# Patient Record
Sex: Female | Born: 1935
Health system: Southern US, Community
[De-identification: ages and names within clinical notes are randomized; demographics above are authoritative.]

## PROBLEM LIST (undated history)

## (undated) DIAGNOSIS — H269 Unspecified cataract: Secondary | ICD-10-CM

## (undated) DIAGNOSIS — F32A Depression, unspecified: Secondary | ICD-10-CM

## (undated) DIAGNOSIS — I10 Essential (primary) hypertension: Secondary | ICD-10-CM

## (undated) DIAGNOSIS — F329 Major depressive disorder, single episode, unspecified: Secondary | ICD-10-CM

## (undated) DIAGNOSIS — R739 Hyperglycemia, unspecified: Secondary | ICD-10-CM

## (undated) HISTORY — PX: CATARACT EXTRACTION, BILATERAL: SHX1313

## (undated) HISTORY — DX: Unspecified cataract: H26.9

## (undated) HISTORY — DX: Essential (primary) hypertension: I10

## (undated) HISTORY — DX: Hyperglycemia, unspecified: R73.9

## (undated) HISTORY — PX: TONSILLECTOMY: SUR1361

---
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
## 2014-11-21 ENCOUNTER — Emergency Department (HOSPITAL_COMMUNITY)
Admission: EM | Admit: 2014-11-21 | Discharge: 2014-11-21 | Disposition: A | Discharge status: Home / Self Care | Payer: Medicare HMO | Attending: Emergency Medicine | Admitting: Emergency Medicine

## 2014-11-21 ENCOUNTER — Ambulatory Visit (INDEPENDENT_AMBULATORY_CARE_PROVIDER_SITE_OTHER): Payer: Medicare HMO

## 2014-11-21 ENCOUNTER — Ambulatory Visit (INDEPENDENT_AMBULATORY_CARE_PROVIDER_SITE_OTHER): Payer: Medicare HMO | Admitting: Emergency Medicine

## 2014-11-21 ENCOUNTER — Telehealth: Payer: Self-pay | Admitting: Physician Assistant

## 2014-11-21 VITALS — BP 192/96 | HR 105 | Temp 98.3°F | Resp 16 | Ht 61.0 in | Wt 150.0 lb

## 2014-11-21 DIAGNOSIS — M79645 Pain in left finger(s): Secondary | ICD-10-CM | POA: Diagnosis not present

## 2014-11-21 DIAGNOSIS — Z8669 Personal history of other diseases of the nervous system and sense organs: Secondary | ICD-10-CM | POA: Insufficient documentation

## 2014-11-21 DIAGNOSIS — Z79899 Other long term (current) drug therapy: Secondary | ICD-10-CM | POA: Diagnosis not present

## 2014-11-21 DIAGNOSIS — I10 Essential (primary) hypertension: Secondary | ICD-10-CM

## 2014-11-21 DIAGNOSIS — R079 Chest pain, unspecified: Secondary | ICD-10-CM

## 2014-11-21 DIAGNOSIS — M25532 Pain in left wrist: Secondary | ICD-10-CM | POA: Diagnosis not present

## 2014-11-21 DIAGNOSIS — Z880 Allergy status to penicillin: Secondary | ICD-10-CM | POA: Diagnosis not present

## 2014-11-21 LAB — BASIC METABOLIC PANEL WITH GFR
BUN: 13 mg/dL (ref 7–25)
CO2: 25 mmol/L (ref 20–31)
CREATININE: 0.76 mg/dL (ref 0.60–0.93)
Calcium: 9.8 mg/dL (ref 8.6–10.4)
Chloride: 104 mmol/L (ref 98–110)
GFR, EST AFRICAN AMERICAN: 88 mL/min (ref >=60)
GFR, Est Non African American: 76 mL/min (ref >=60)
GLUCOSE: 96 mg/dL (ref 65–99)
Potassium: 4.2 mmol/L (ref 3.5–5.3)
Sodium: 141 mmol/L (ref 135–146)

## 2014-11-21 LAB — BASIC METABOLIC PANEL
ANION GAP: 11 (ref 5–15)
BUN: 10 mg/dL (ref 6–20)
CO2: 26 mmol/L (ref 22–32)
CREATININE: 0.8 mg/dL (ref 0.44–1.00)
Calcium: 10 mg/dL (ref 8.9–10.3)
Chloride: 104 mmol/L (ref 101–111)
Glucose, Bld: 90 mg/dL (ref 65–99)
Potassium: 3.7 mmol/L (ref 3.5–5.1)
SODIUM: 141 mmol/L (ref 135–145)

## 2014-11-21 LAB — POCT CBC
Granulocyte percent: 48.3 %G (ref 37–80)
HCT, POC: 44.5 % (ref 37.7–47.9)
Hemoglobin: 14.4 g/dL (ref 12.2–16.2)
LYMPH, POC: 5.1 — AB (ref 0.6–3.4)
MCH, POC: 28.8 pg (ref 27–31.2)
MCHC: 32.4 g/dL (ref 31.8–35.4)
MCV: 89.1 fL (ref 80–97)
MID (cbc): 0.7 (ref 0–0.9)
MPV: 7 fL (ref 0–99.8)
PLATELET COUNT, POC: 203 10*3/uL (ref 142–424)
POC GRANULOCYTE: 5.4 (ref 2–6.9)
POC LYMPH PERCENT: 45.2 %L (ref 10–50)
POC MID %: 6.5 % (ref 0–12)
RBC: 4.99 M/uL (ref 4.04–5.48)
RDW, POC: 13.9 %
WBC: 11.2 10*3/uL — AB (ref 4.6–10.2)

## 2014-11-21 LAB — CBC WITH DIFFERENTIAL/PLATELET
BASOS ABS: 0.1 10*3/uL (ref 0.0–0.1)
Basophils Relative: 1 % (ref 0–1)
EOS ABS: 0.4 10*3/uL (ref 0.0–0.7)
Eosinophils Relative: 3 % (ref 0–5)
HCT: 44.5 % (ref 36.0–46.0)
HEMOGLOBIN: 14.6 g/dL (ref 12.0–15.0)
LYMPHS ABS: 5.3 10*3/uL — AB (ref 0.7–4.0)
Lymphocytes Relative: 46 % (ref 12–46)
MCH: 29.8 pg (ref 26.0–34.0)
MCHC: 32.8 g/dL (ref 30.0–36.0)
MCV: 90.8 fL (ref 78.0–100.0)
Monocytes Absolute: 0.6 10*3/uL (ref 0.1–1.0)
Monocytes Relative: 6 % (ref 3–12)
Neutro Abs: 5 10*3/uL (ref 1.7–7.7)
Neutrophils Relative %: 44 % (ref 43–77)
PLATELETS: 185 10*3/uL (ref 150–400)
RBC: 4.9 MIL/uL (ref 3.87–5.11)
RDW: 14.3 % (ref 11.5–15.5)
WBC: 11.3 10*3/uL — AB (ref 4.0–10.5)

## 2014-11-21 LAB — I-STAT TROPONIN, ED: Troponin i, poc: 0 ng/mL (ref 0.00–0.08)

## 2014-11-21 LAB — TSH: TSH: 2.996 u[IU]/mL (ref 0.350–4.500)

## 2014-11-21 MED ORDER — ASPIRIN 81 MG PO CHEW
324.0000 mg | CHEWABLE_TABLET | Freq: Once | ORAL | Status: AC
  Administered 2014-11-21: 324 mg via ORAL
  Filled 2014-11-21: qty 4

## 2014-11-21 MED ORDER — NITROGLYCERIN 0.4 MG SL SUBL
0.4000 mg | SUBLINGUAL_TABLET | SUBLINGUAL | Status: DC | PRN
  Filled 2014-11-21: qty 1

## 2014-11-21 MED ORDER — AMLODIPINE BESYLATE 5 MG PO TABS: 5.0000 mg | ORAL_TABLET | Freq: Every day | ORAL | Status: DC

## 2014-11-21 NOTE — Telephone Encounter (Signed)
ER physician called Trish/cardmaster pager to request close outpatient follow-up for this new patient. Per report had chest pain 2 weeks ago. Sent to ER by PCP for abnormal EKG but had not had any recent recurrent chest pain. ER will be discharging home but requests very soon appointment. OK to put on flex as new patient. Please call the patient to arrange. Dayna Dunn PA-C

## 2014-11-21 NOTE — Discharge Instructions (Signed)
If you were given medicines take as directed.  If you are on coumadin or contraceptives realize their levels and effectiveness is altered by many different medicines.  If you have any reaction (rash, tongues swelling, other) to the medicines stop taking and see a physician.   Take aspirin daily until you see the cardiologist to discuss further. The cardiologist will call you for an appointment moral. If your blood pressure was elevated in the ER make sure you follow up for management with a primary doctor or return for chest pain, shortness of breath or stroke symptoms.  Please follow up as directed and return to the ER or see a physician for new or worsening symptoms.  Thank you. Filed Vitals:   11/21/14 1249 11/21/14 1250 11/21/14 1300 11/21/14 1315  BP: 148/90 148/90 149/87 152/98  Pulse:  75 71 79  Temp:      TempSrc:      Resp:  SpO2:  98% 97% 97%

## 2014-11-21 NOTE — ED Notes (Signed)
Patient sent to [facility] ED from [facility]gent care with C/O abnormal EKG. Denies chest pain, However C/O chest pain 2 weeks ago

## 2014-11-21 NOTE — Progress Notes (Addendum)
Patient ID: Kara Solomon, female   DOB: 11/10/1935, 79 y.o.   MRN: [id number]    This chart was scribed for Earl Lites, MD by [facility], medical scribe at [facility].The patient was seen in exam room 07 and the patient's care was started at 9:40 AM.  Chief Complaint:  Chief Complaint  Patient presents with  . Wrist Pain    Left/ onset 1 year off and on, getting worse.   HPI: Kara Solomon is a 79 y.o. female who reports to [facility] today complaining of left wrist pain worsening since last night. She has had this pain intermittently for the past year. Knits frequently, but has not this past summer. She also complains of a left shoulder pain. No known injuries or trauma.  Blood pressure is elevated today, she says this is due to being in the office. She does not take medication for this. Nobody follows her blood pressure. Recheck is 190/100. She did complain of chest pain, which was two weeks ago but this has resolved. Recent family stressors, nephew recently passed away and son-in-law committed suicide. Also, comforting her daughter. Returned from [place]. A Non-smoker.  Past Medical History  Diagnosis Date  . Cataract    History reviewed. No pertinent past surgical history. History   Social History  . Marital Status: Married    Spouse Name: N/A  . Number of Children: N/A  . Years of Education: N/A   Social History Main Topics  . Smoking status: Never Smoker   . Smokeless tobacco: Not on file  . Alcohol Use: No  . Drug Use: No  . Sexual Activity: Not on file   Other Topics Concern  . None   Social History Narrative  . None   Family History  Problem Relation Age of Onset  . Heart disease Sister    Allergies  Allergen Reactions  . Penicillins Hives   Prior to Admission medications   Medication Sig Start Date End Date Taking? Authorizing Provider  Multiple Vitamins-Minerals (MULTIVITAMIN WITH MINERALS) tablet Take 1 tablet by mouth daily.   Yes  Historical Provider, MD   ROS: The patient denies fevers, chills, night sweats, unintentional weight loss, chest pain, palpitations, wheezing, dyspnea on exertion, nausea, vomiting, abdominal pain, dysuria, hematuria, melena, numbness, weakness, or tingling.   All other systems have been reviewed and were otherwise negative with the exception of those mentioned in the HPI and as above.    PHYSICAL EXAM: Filed Vitals:   11/21/14 0852  BP: 192/96  Pulse: 105  Temp: 98.3 F (36.8 C)  Resp: 16   Body mass index is 28.36 kg/(m^2).  General: Alert, no acute distress HEENT:  Normocephalic, atraumatic, oropharynx patent. Eye: Nonie Hoyer [facility] Cardiovascular:  Regular rate and rhythm, no rubs murmurs or gallops.  No Carotid bruits, radial pulse intact. No pedal edema.  Respiratory: Clear to auscultation bilaterally.  No wheezes, rales, or rhonchi.  No cyanosis, no use of accessory musculature Abdominal: No organomegaly, abdomen is soft and non-tender, positive bowel sounds.  No masses. Musculoskeletal: Gait intact. No edema, tenderness Skin: No rashes. Neurologic: Facial musculature symmetric. Psychiatric: Patient acts appropriately throughout our interaction. Lymphatic: No cervical or submandibular lymphadenopathy Genitourinary/Anorectal: No acute findings  LABS: No results found for this or any previous visit.  EKG/XRAY:   Primary read interpreted by Dr. Cleta Alberts at [facility]. There is an atelectatic area right midlung. Heart size is normal. Wrist and thumb films are normal. EKG shows poor R-wave progression across  the precordium with loss of the R-wave in lead V3. There is T-wave inversion in V1 V2 suspicious for previous anterior injury. ASSESSMENT/PLAN: Patient had an episode of chest pain about 2 weeks ago. She's also had a travel history. Her EKG shows the possibility of previous injury to the heart. Will send by EMS for enzymes and consideration for d-dimer with her travel history and history  of chest pain. I am not sure if the discomfort in her left wrist and thumb is related to an orthopedic problem or heart/lung  related. There is no EKG available for comparison. Gross sideeffects, risk and benefits, and alternatives of medications d/w patient. Patient is aware that all medications have potential sideeffects and we are unable to predict every sideeffect or drug-drug interaction that may occur.    Lesle Chris MD 11/21/2014 9:42 AM

## 2014-11-21 NOTE — ED Provider Notes (Signed)
CSN: [id number]     Arrival date & time 11/21/14  1131 History   First MD Initiated Contact with Patient 11/21/14 1136     No chief complaint on file.    (Consider location/radiation/quality/duration/timing/severity/associated sxs/prior Treatment) HPI Comments: 79 year old female with no known medical problems nonsmoker, uses urgent care for primary Dr. was sent over from urgent care for abnormal EKG. Patient went to urgent care for left wrist pain worse with movement that was worsening earlier today. Patient had brief episode of chest pain lasting 20 minutes that may evoke her from sleep 2 weeks prior. Patient has not had any chest pain since then. No radiation down the left arm. No diaphoresis or exertional symptoms. Patient had mild nausea a few days back. No recent stress test no heart history known, Patient denies blood clot history, active cancer, recent major trauma or surgery, unilateral leg swelling/ pain, hemoptysis or oral contraceptives. Patient did have tripped overseas a while back proximal to go however said no pleuritic pain no shortness of breath, no leg swelling no blood clot history.   The history is provided by the patient.    Past Medical History  Diagnosis Date  . Cataract    No past surgical history on file. Family History  Problem Relation Age of Onset  . Heart disease Sister    History  Substance Use Topics  . Smoking status: Never Smoker   . Smokeless tobacco: Not on file  . Alcohol Use: No   OB History    No data available     Review of Systems  Constitutional: Negative for fever and chills.  HENT: Negative for congestion.   Eyes: Negative for visual disturbance.  Respiratory: Negative for shortness of breath.   Cardiovascular: Positive for chest pain.  Gastrointestinal: Negative for vomiting and abdominal pain.  Genitourinary: Negative for dysuria and flank pain.  Musculoskeletal: Positive for arthralgias. Negative for back pain, neck pain and neck  stiffness.  Skin: Negative for rash.  Neurological: Negative for light-headedness and headaches.      Allergies  Penicillins  Home Medications   Prior to Admission medications   Medication Sig Start Date End Date Taking? Authorizing Provider  Multiple Vitamins-Minerals (MULTIVITAMIN WITH MINERALS) tablet Take 1 tablet by mouth daily.   Yes Historical Provider, MD  amLODipine (NORVASC) 5 MG tablet Take 1 tablet (5 mg total) by mouth daily. 11/21/14   Blane Ohara, MD   BP 152/98 mmHg  Pulse 79  Temp(Src) 98.3 F (36.8 C) (Oral)  Resp 16  SpO2 97% Physical Exam  Constitutional: She is oriented to person, place, and time. She appears well-developed and well-nourished.  HENT:  Head: Normocephalic and atraumatic.  Eyes: Conjunctivae are normal. Right eye exhibits no discharge. Left eye exhibits no discharge.  Neck: Normal range of motion. Neck supple. No tracheal deviation present.  Cardiovascular: Normal rate, regular rhythm and intact distal pulses.   Pulmonary/Chest: Effort normal and breath sounds normal.  Abdominal: Soft. She exhibits no distension. There is no tenderness. There is no guarding.  Musculoskeletal: She exhibits no edema or tenderness.  Neurological: She is alert and oriented to person, place, and time.  Skin: Skin is warm. No rash noted.  Psychiatric: She has a normal mood and affect.  Nursing note and vitals reviewed.   ED Course  Procedures (including critical care time) Labs Review Labs Reviewed  CBC WITH DIFFERENTIAL/PLATELET - Abnormal; Notable for the following:    WBC 11.3 (*)    Lymphs Abs 5.3 (*)  All other components within normal limits  BASIC METABOLIC PANEL  I-STAT TROPOININ, ED    Imaging Review Dg Chest 2 View  11/21/2014   CLINICAL DATA:  Shortness of breath  EXAM: CHEST  2 VIEW  COMPARISON:  None.  FINDINGS: No active infiltrate or effusion is seen. There may be minimal linear atelectasis at the left lung base. Mediastinal and hilar  contours are unremarkable. Cardiomegaly is noted. No bony abnormality is seen.  IMPRESSION: No active lung disease. Probable mild linear atelectasis or scarring at the left lung base.   Electronically Signed   By: Dwyane Dee M.D.   On: 11/21/2014 10:48   Dg Wrist Complete Left  11/21/2014   CLINICAL DATA:  Wrist pain  EXAM: LEFT WRIST - COMPLETE 3+ VIEW  COMPARISON:  None.  FINDINGS: The radiocarpal joint space appears normal. The ulnar styloid is intact. The carpal bones are in normal position with normal alignment.  IMPRESSION: Negative.   Electronically Signed   By: Dwyane Dee M.D.   On: 11/21/2014 10:50   Dg Finger Thumb Left  11/21/2014   CLINICAL DATA:  Left thumb pain  EXAM: LEFT THUMB 2+V  COMPARISON:  None.  FINDINGS: No acute bony abnormality. Specifically, no fracture, subluxation, or dislocation. Soft tissues are intact.  IMPRESSION: No acute bony abnormality.   Electronically Signed   By: Charlett Nose M.D.   On: 11/21/2014 10:53     EKG Interpretation   Date/Time:  Wednesday November 21 2014 11:37:40 EDT Ventricular Rate:  85 PR Interval:  163 QRS Duration: 81 QT Interval:  408 QTC Calculation: 485 R Axis:   -12 Text Interpretation:  Sinus rhythm Probable left atrial enlargement Low  voltage, precordial leads Consider anterior infarct Confirmed by Hazelene Doten   MD, Jash Wahlen ([id number]) on 11/21/2014 11:41:32 AM      MDM   Final diagnoses:  Chest pain, unspecified chest pain type  Essential hypertension   Patient presents after 20 minute chest pain 2 weeks prior. Patient has mild T-wave inversion in 2 leads, no active chest pain or shortness of breath for 2 weeks. Vitals improved with no treatment blood pressure 140s during my exam systolic. Discussed observation of his close outpatient follow-up, patient comfort with close outpatient follow-up. I discussed with cardiology physician assistant directly will have them call the patient for close appointment and likely stress test. Recommended  aspirin daily until then. We'll start blood pressure medication.  WELLS 0.  Results and differential diagnosis were discussed with the patient/parent/guardian. Xrays were independently reviewed by myself.  Close follow up outpatient was discussed, comfortable with the plan.   Medications  aspirin chewable tablet 324 mg (324 mg Oral Given 11/21/14 1318)    Filed Vitals:   11/21/14 1249 11/21/14 1250 11/21/14 1300 11/21/14 1315  BP: 148/90 148/90 149/87 152/98  Pulse:  75 71 79  Temp:      TempSrc:      Resp:  11 18 16   SpO2:  98% 97% 97%    Final diagnoses:  Chest pain, unspecified chest pain type  Essential hypertension        Blane Ohara, MD 11/21/14 1357

## 2014-11-22 ENCOUNTER — Encounter: Payer: Self-pay | Admitting: Family Medicine

## 2014-11-26 ENCOUNTER — Ambulatory Visit (INDEPENDENT_AMBULATORY_CARE_PROVIDER_SITE_OTHER): Payer: Medicare HMO | Admitting: Nurse Practitioner

## 2014-11-26 ENCOUNTER — Encounter: Payer: Self-pay | Admitting: Nurse Practitioner

## 2014-11-26 VITALS — BP 220/90 | HR 106 | Ht 61.5 in | Wt 152.0 lb

## 2014-11-26 DIAGNOSIS — R079 Chest pain, unspecified: Secondary | ICD-10-CM

## 2014-11-26 DIAGNOSIS — I10 Essential (primary) hypertension: Secondary | ICD-10-CM | POA: Diagnosis not present

## 2014-11-26 MED ORDER — LISINOPRIL 10 MG PO TABS: 10.0000 mg | ORAL_TABLET | Freq: Every day | ORAL | Status: DC

## 2014-11-26 NOTE — Progress Notes (Signed)
CARDIOLOGY OFFICE NOTE  Date:  11/26/2014    Kara Solomon Date of Birth: March 13, 1936 Medical Record #[id number]  PCP:  No PCP Per Patient  Cardiologist:  Mayford Knife (NEW)    Chief Complaint  Patient presents with  . Chest Pain    New patient visit - seen for Dr. Mayford Knife (DOD)    History of Present Illness: Kara Solomon is a 79 y.o. female who presents today for a new patient visit. Seen for Dr. Mayford Knife. She has been referred by PCP.   She was in the ER last week - had been sent over by urgent care for abnormal EKG. She had been to the Urgent Care for left wrist pain. She has had wrist pain for 1 year off and on - thought it may be carpal tunnel (she knits a lot). BP quite high at UC.  She had had had brief episode of chest pain lasting 20 minutes that may have evoked her from sleep 2 weeks prior. She has just recently been placed on Norvasc for her BP.   Her evaluation was negative in the ER. She was to follow up here and be considered for a stress test.  Comes in today. Here with her daughter. She has really never been one to go to the doctor. She has had HTN - really on no medicines until here recently. BP has probably been up for quite some time. She does not like to take medicines.  She does not smoke. No real FH for CAD. Eats out frequently - most likely gets too much salt. Notes that her taste buds are off. She took her wrist pain as a "warning" that she needed to get checked out. She was in [place] back in June/July for a wedding and did fine - lots of walking, etc. She admits to being nervous today and anxious.   Past Medical History  Diagnosis Date  . Cataract   . HTN (hypertension)   . Borderline hyperglycemia     Past Surgical History  Procedure Laterality Date  . Tonsillectomy       Medications: Current Outpatient Prescriptions  Medication Sig Dispense Refill  . amLODipine (NORVASC) 5 MG tablet Take 1 tablet (5 mg total) by mouth daily. 30 tablet 0  . Multiple  Vitamins-Minerals (MULTIVITAMIN WITH MINERALS) tablet Take 1 tablet by mouth daily.    Marland Kitchen lisinopril (PRINIVIL,ZESTRIL) 10 MG tablet Take 1 tablet (10 mg total) by mouth daily. 30 tablet 6   No current facility-administered medications for this visit.    Allergies: Allergies  Allergen Reactions  . Penicillins Hives    Social History: The patient  reports that she has never smoked. She does not have any smokeless tobacco history on file. She reports that she does not drink alcohol or use illicit drugs.   Family History: The patient's family history includes Heart disease in her sister; Heart failure in her mother; Stroke (age of onset: 21) in her father.   Review of Systems: Please see the history of present illness.   Otherwise, the review of systems is positive for none.   All other systems are reviewed and negative.   Physical Exam: VS:  BP 220/90 mmHg  Pulse 106  Ht 5' 1.5" (1.562 m)  Wt 152 lb (68.947 kg)  BMI 28.26 kg/m2  SpO2 94% .  BMI Body mass index is 28.26 kg/(m^2).  Wt Readings from Last 3 Encounters:  11/26/14 152 lb (68.947 kg)  11/21/14 150 lb (68.04 kg)  General: Pleasant. Well developed, well nourished and in no acute distress.  HEENT: Normal. Neck: Supple, no JVD, carotid bruits, or masses noted.  Cardiac: Regular rate and rhythm. No murmurs, rubs, or gallops. No edema.  Respiratory:  Lungs are clear to auscultation bilaterally with normal work of breathing.  GI: Soft and nontender.  MS: No deformity or atrophy. Gait and ROM intact. Skin: Warm and dry. Color is normal.  Neuro:  Strength and sensation are intact and no gross focal deficits noted.  Psych: Alert, appropriate and with normal affect.   LABORATORY DATA:  EKG:  EKG is ordered today. This demonstrates NSR with poor R wave progression. Reviewed with Dr. Mayford Knife.   Lab Results  Component Value Date   WBC 11.3* 11/21/2014   HGB 14.6 11/21/2014   HCT 44.5 11/21/2014   PLT 185 11/21/2014    GLUCOSE 90 11/21/2014   NA 141 11/21/2014   K 3.7 11/21/2014   CL 104 11/21/2014   CREATININE 0.80 11/21/2014   BUN 10 11/21/2014   CO2 26 11/21/2014   TSH 2.996 11/21/2014    BNP (last 3 results) No results for input(s): BNP in the last 8760 hours.  ProBNP (last 3 results) No results for input(s): PROBNP in the last 8760 hours.   Other Studies Reviewed Today:   Assessment/Plan: 1. Chest pain/wrist pain - uncertain etiology. No exertional symptoms reported.   2. Abnormal EKG  3. HTN - uncontrolled - I suspect it has been uncontrolled for quite some time. Will need to bring her down slowly.   Discussed with Dr. Mayford Knife - will arrange for stress Myoview and echocardiogram. Adding Lisinopril 10 mg a day. Check fasting labs on day of her testing. I will see back in 2 weeks.   Current medicines are reviewed with the patient today.  The patient does not have concerns regarding medicines other than what has been noted above.  The following changes have been made:  See above.  Labs/ tests ordered today include:    Orders Placed This Encounter  Procedures  . Hepatic function panel  . Lipid panel  . Basic metabolic panel  . Myocardial Perfusion Imaging  . EKG 12-Lead  . ECHOCARDIOGRAM COMPLETE     Disposition:   Further disposition to follow.   Patient is agreeable to this plan and will call if any problems develop in the interim.   Signed: Rosalio Macadamia, RN, ANP-C 11/26/2014 3:41 PM  [facility] [address] [place], [place]  [address] Phone: [phone] Fax: [phone]

## 2014-11-26 NOTE — Patient Instructions (Addendum)
We will be checking the following labs today - NONE  Fasting BMET, Lipids/LFTs on day of testing when fasting  Medication Instructions:    Continue with your current medicines. But I am  Adding Lisinopril 10 mg a day - this is at your drug store - take the first dose tonight at bedtime - and then once each morning    Testing/Procedures To Be Arranged:  We will arrange for a stress Myoview  We will arrange for an echocardiogram  Follow-Up:   See me in 2 weeks.   Nurse visit to check BP on day of testing please  Fasting labs on day of testing   Other Special Instructions:   N/A  Call the [facility] office at [phone] if you have any questions, problems or concerns.

## 2014-11-28 ENCOUNTER — Telehealth: Payer: Self-pay

## 2014-11-28 NOTE — Telephone Encounter (Signed)
NO NOTES TO BE FOUND

## 2014-12-03 ENCOUNTER — Telehealth (HOSPITAL_COMMUNITY): Payer: Self-pay | Admitting: *Deleted

## 2014-12-03 NOTE — Telephone Encounter (Signed)
Patient given detailed instructions per Myocardial Perfusion Study Information Sheet for test on 12/05/14 at 0815. Patient Notified to arrive 15 minutes early, and that it is imperative to arrive on time for appointment to keep from having the test rescheduled. Patient verbalized understanding. Balbina Depace, Adelene Idler

## 2014-12-05 ENCOUNTER — Ambulatory Visit (HOSPITAL_COMMUNITY): Payer: Medicare HMO | Attending: Cardiovascular Disease

## 2014-12-05 ENCOUNTER — Other Ambulatory Visit (INDEPENDENT_AMBULATORY_CARE_PROVIDER_SITE_OTHER): Payer: Medicare HMO | Admitting: *Deleted

## 2014-12-05 ENCOUNTER — Other Ambulatory Visit (HOSPITAL_COMMUNITY): Payer: Medicare HMO

## 2014-12-05 ENCOUNTER — Ambulatory Visit (HOSPITAL_BASED_OUTPATIENT_CLINIC_OR_DEPARTMENT_OTHER): Payer: Medicare HMO

## 2014-12-05 ENCOUNTER — Other Ambulatory Visit: Payer: Self-pay

## 2014-12-05 DIAGNOSIS — I10 Essential (primary) hypertension: Secondary | ICD-10-CM | POA: Insufficient documentation

## 2014-12-05 DIAGNOSIS — R079 Chest pain, unspecified: Secondary | ICD-10-CM | POA: Diagnosis not present

## 2014-12-05 LAB — MYOCARDIAL PERFUSION IMAGING
Estimated workload: 4.6 METS
Exercise duration (min): 5 min
Exercise duration (sec): 0 s
LV dias vol: 47 mL
LV sys vol: 9 mL
MPHR: 143 {beats}/min
Peak HR: 139 {beats}/min
Percent HR: 97 %
RATE: 0.3
Rest HR: 76 {beats}/min
SDS: 1
SRS: 2
SSS: 3
TID: 0.88

## 2014-12-05 LAB — HEPATIC FUNCTION PANEL
ALT: 48 U/L — ABNORMAL HIGH (ref 0–35)
AST: 59 U/L — ABNORMAL HIGH (ref 0–37)
Albumin: 4.1 g/dL (ref 3.5–5.2)
Alkaline Phosphatase: 88 U/L (ref 39–117)
Bilirubin, Direct: 0.1 mg/dL (ref 0.0–0.3)
Total Bilirubin: 0.7 mg/dL (ref 0.2–1.2)
Total Protein: 7.4 g/dL (ref 6.0–8.3)

## 2014-12-05 LAB — LIPID PANEL
Cholesterol: 255 mg/dL — ABNORMAL HIGH (ref 0–200)
HDL: 43.4 mg/dL (ref >39.00)
LDL Cholesterol: 173 mg/dL — ABNORMAL HIGH (ref 0–99)
NonHDL: 212.08
Total CHOL/HDL Ratio: 6
Triglycerides: 195 mg/dL — ABNORMAL HIGH (ref 0.0–149.0)
VLDL: 39 mg/dL (ref 0.0–40.0)

## 2014-12-05 LAB — BASIC METABOLIC PANEL
BUN: 14 mg/dL (ref 6–23)
CO2: 25 mEq/L (ref 19–32)
Calcium: 10.1 mg/dL (ref 8.4–10.5)
Chloride: 105 mEq/L (ref 96–112)
Creatinine, Ser: 0.72 mg/dL (ref 0.40–1.20)
GFR: 83.42 mL/min (ref >60.00)
Glucose, Bld: 107 mg/dL — ABNORMAL HIGH (ref 70–99)
Potassium: 4.1 mEq/L (ref 3.5–5.1)
Sodium: 139 mEq/L (ref 135–145)

## 2014-12-05 MED ORDER — TECHNETIUM TC 99M SESTAMIBI GENERIC - CARDIOLITE
31.6000 | Freq: Once | INTRAVENOUS | Status: AC | PRN
  Administered 2014-12-05: 32 via INTRAVENOUS

## 2014-12-05 MED ORDER — TECHNETIUM TC 99M SESTAMIBI GENERIC - CARDIOLITE
9.8000 | Freq: Once | INTRAVENOUS | Status: AC | PRN
  Administered 2014-12-05: 10 via INTRAVENOUS

## 2014-12-10 ENCOUNTER — Encounter: Payer: Self-pay | Admitting: Nurse Practitioner

## 2014-12-10 ENCOUNTER — Ambulatory Visit (INDEPENDENT_AMBULATORY_CARE_PROVIDER_SITE_OTHER): Payer: Medicare HMO | Admitting: Nurse Practitioner

## 2014-12-10 VITALS — BP 142/80 | HR 87 | Ht 61.5 in | Wt 151.4 lb

## 2014-12-10 DIAGNOSIS — R079 Chest pain, unspecified: Secondary | ICD-10-CM | POA: Diagnosis not present

## 2014-12-10 DIAGNOSIS — I10 Essential (primary) hypertension: Secondary | ICD-10-CM | POA: Diagnosis not present

## 2014-12-10 LAB — BASIC METABOLIC PANEL
BUN: 16 mg/dL (ref 6–23)
CO2: 25 mEq/L (ref 19–32)
Calcium: 9.9 mg/dL (ref 8.4–10.5)
Chloride: 104 mEq/L (ref 96–112)
Creatinine, Ser: 0.74 mg/dL (ref 0.40–1.20)
GFR: 80.82 mL/min (ref >60.00)
Glucose, Bld: 132 mg/dL — ABNORMAL HIGH (ref 70–99)
Potassium: 4.1 mEq/L (ref 3.5–5.1)
Sodium: 140 mEq/L (ref 135–145)

## 2014-12-10 NOTE — Progress Notes (Signed)
CARDIOLOGY OFFICE NOTE  Date:  12/10/2014    Kara Solomon Date of Birth: 05-02-1935 Medical Record #[id number]  PCP:  No PCP Per Patient  Cardiologist:  Mayford Knife    Chief Complaint  Patient presents with  . FU post Myoview and echo for abnormal EKG; HTN    Seen for Dr. Mayford Knife    History of Present Illness: Kara Solomon is a 79 y.o. female who presents today for a follow up visit. Seen for Dr. Mayford Knife. Seen by me in the FLEX earlier this month as a new patient. She had been referred by PCP.   She had been in the ER earlier this month - had been sent over by urgent care for abnormal EKG. She had been to the Urgent Care for left wrist pain. She has had wrist pain for 1 year off and on - thought it may be carpal tunnel (she knits a lot). BP quite high at UC. She had had had brief episode of chest pain lasting 20 minutes that may have evoked her from sleep 2 weeks prior. She was just recently been placed on Norvasc for her BP.   Her evaluation was negative in the ER. She was to follow up here and be considered for a stress test.  I saw her back about 2 weeks ago. BP high. Arranged for Myoview and echo. Lisinopril was added.    Comes in today. Here with her daughter. Her chest pain is basically resolved - few little "twinges".   Past Medical History  Diagnosis Date  . Cataract   . HTN (hypertension)   . Borderline hyperglycemia     Past Surgical History  Procedure Laterality Date  . Tonsillectomy       Medications: Current Outpatient Prescriptions  Medication Sig Dispense Refill  . amLODipine (NORVASC) 5 MG tablet Take 1 tablet (5 mg total) by mouth daily. 30 tablet 0  . lisinopril (PRINIVIL,ZESTRIL) 10 MG tablet Take 1 tablet (10 mg total) by mouth daily. 30 tablet 6  . Multiple Vitamins-Minerals (MULTIVITAMIN WITH MINERALS) tablet Take 1 tablet by mouth daily.     No current facility-administered medications for this visit.    Allergies: Allergies  Allergen  Reactions  . Penicillins Hives    Social History: The patient  reports that she has never smoked. She does not have any smokeless tobacco history on file. She reports that she does not drink alcohol or use illicit drugs.   Family History: The patient's family history includes Heart disease in her sister; Heart failure in her mother; Stroke (age of onset: 34) in her father.   Review of Systems: Please see the history of present illness.   Otherwise, the review of systems is positive for memory issues.   All other systems are reviewed and negative.   Physical Exam: VS:  BP 142/80 mmHg  Pulse 87  Ht 5' 1.5" (1.562 m)  Wt 151 lb 6.4 oz (68.675 kg)  BMI 28.15 kg/m2  SpO2 97% .  BMI Body mass index is 28.15 kg/(m^2).  Wt Readings from Last 3 Encounters:  12/10/14 151 lb 6.4 oz (68.675 kg)  12/05/14 152 lb (68.947 kg)  11/26/14 152 lb (68.947 kg)    General: Pleasant. Well developed, well nourished and in no acute distress.  HEENT: Normal. Neck: Supple, no JVD, carotid bruits, or masses noted.  Cardiac: Regular rate and rhythm. No murmurs, rubs, or gallops. No edema.  Respiratory:  Lungs are clear to auscultation bilaterally with normal  work of breathing.  GI: Soft and nontender.  MS: No deformity or atrophy. Gait and ROM intact. Skin: Warm and dry. Color is normal.  Neuro:  Strength and sensation are intact and no gross focal deficits noted.  Psych: Alert, appropriate and with normal affect.   LABORATORY DATA:  EKG:  EKG is not ordered today.  Lab Results  Component Value Date   WBC 11.3* 11/21/2014   HGB 14.6 11/21/2014   HCT 44.5 11/21/2014   PLT 185 11/21/2014   GLUCOSE 107* 12/05/2014   CHOL 255* 12/05/2014   TRIG 195.0* 12/05/2014   HDL 43.40 12/05/2014   LDLCALC 173* 12/05/2014   ALT 48* 12/05/2014   AST 59* 12/05/2014   NA 139 12/05/2014   K 4.1 12/05/2014   CL 105 12/05/2014   CREATININE 0.72 12/05/2014   BUN 14 12/05/2014   CO2 25 12/05/2014   TSH  2.996 11/21/2014    BNP (last 3 results) No results for input(s): BNP in the last 8760 hours.  ProBNP (last 3 results) No results for input(s): PROBNP in the last 8760 hours.   Other Studies Reviewed Today:  Myoview Study Highlights from August 2016     Nuclear stress EF: 82%.  There was no ST segment deviation noted during stress.  The study is normal.  The left ventricular ejection fraction is hyperdynamic (>65%).  Normal study, no ischemia or infarction.    Echo Study Conclusions from August 2016  - Left ventricle: The cavity size was normal. Wall thickness was normal. Systolic function was normal. The estimated ejection fraction was in the range of 60% to 65%. Wall motion was normal; there were no regional wall motion abnormalities. Doppler parameters are consistent with abnormal left ventricular relaxation (grade 1 diastolic dysfunction).  Assessment/Plan:  1. Chest pain/wrist pain - uncertain etiology. No exertional symptoms reported. Her Myoview looks good. Would manage with CV risk factor modification. She seems motivated to get back into a walking program, work on her weight, etc.   2. Abnormal EKG  3. HTN - coming down slowly. Ok on her current regimen. She will monitor at home.   4. HLD - she does not wish to take additional medicines but would like a trial of diet/exercise/weight loss  5. General health maintenance - really has not had her general maintenance (mammogram, vaccines, etc). Suggested she get back to the UC for these issues.  6. Memory issues - encouraged to start with UC/Primary care  Current medicines are reviewed with the patient today.  The patient does not have concerns regarding medicines other than what has been noted above.  The following changes have been made:  See above.  Labs/ tests ordered today include:    Orders Placed This Encounter  Procedures  . Basic metabolic panel     Disposition:   FU with me in 4  months.   Patient is agreeable to this plan and will call if any problems develop in the interim.   Signed: Rosalio Macadamia, RN, ANP-C 12/10/2014 8:09 AM  [facility] [address] [place], [place]  [address] Phone: [phone] Fax: [phone]

## 2014-12-10 NOTE — Patient Instructions (Addendum)
We will be checking the following labs today - BMET   Medication Instructions:    Continue with your current medicines.     Testing/Procedures To Be Arranged:  N/A  Follow-Up:   See me in 4 months  Think about going back to Urgent Care for primary care.     Other Special Instructions:   Watch your salt  Monitor your blood pressure at home.  Walking every day Here are my tips to lose weight:  1. Drink only water. You do not need milk, juice, tea, soda or diet soda.  2. Do not eat anything "white". This includes white bread, potatoes, rice or mayo  3. Stay away from fried foods and sweets  4. Your portion should be the size of the palm of your hand.  5. Know what your weaknesses are and avoid.  6. Find an exercise you like and do it every day for 45 to 60 minutes.        Call the [facility] office at [phone] if you have any questions, problems or concerns.

## 2014-12-20 ENCOUNTER — Other Ambulatory Visit: Payer: Self-pay

## 2014-12-20 MED ORDER — AMLODIPINE BESYLATE 5 MG PO TABS: 5.0000 mg | ORAL_TABLET | Freq: Every day | ORAL | Status: DC

## 2014-12-20 NOTE — Telephone Encounter (Signed)
Rosalio Macadamia, NP at 12/10/2014 7:58 AM   amLODipine (NORVASC) 5 MG tablet Take 1 tablet (5 mg total) by mouth daily.         3. HTN - coming down slowly. Ok on her current regimen. She will monitor at home.

## 2015-04-02 ENCOUNTER — Ambulatory Visit: Payer: Medicare HMO | Admitting: Nurse Practitioner

## 2015-05-07 ENCOUNTER — Encounter: Payer: Self-pay | Admitting: Nurse Practitioner

## 2015-05-07 ENCOUNTER — Ambulatory Visit (INDEPENDENT_AMBULATORY_CARE_PROVIDER_SITE_OTHER): Payer: Medicare HMO | Admitting: Nurse Practitioner

## 2015-05-07 VITALS — BP 150/80 | HR 64 | Ht 61.5 in | Wt 142.0 lb

## 2015-05-07 DIAGNOSIS — I10 Essential (primary) hypertension: Secondary | ICD-10-CM

## 2015-05-07 DIAGNOSIS — R0789 Other chest pain: Secondary | ICD-10-CM | POA: Diagnosis not present

## 2015-05-07 DIAGNOSIS — E785 Hyperlipidemia, unspecified: Secondary | ICD-10-CM

## 2015-05-07 LAB — LIPID PANEL
Cholesterol: 250 mg/dL — ABNORMAL HIGH (ref 125–200)
HDL: 45 mg/dL — ABNORMAL LOW (ref >=46)
LDL Cholesterol: 169 mg/dL — ABNORMAL HIGH (ref <130)
Total CHOL/HDL Ratio: 5.6 Ratio — ABNORMAL HIGH (ref <=5.0)
Triglycerides: 181 mg/dL — ABNORMAL HIGH (ref <150)
VLDL: 36 mg/dL — ABNORMAL HIGH (ref <30)

## 2015-05-07 LAB — BASIC METABOLIC PANEL
BUN: 18 mg/dL (ref 7–25)
CO2: 24 mmol/L (ref 20–31)
Calcium: 9.4 mg/dL (ref 8.6–10.4)
Chloride: 106 mmol/L (ref 98–110)
Creat: 0.76 mg/dL (ref 0.60–0.93)
Glucose, Bld: 84 mg/dL (ref 65–99)
Potassium: 4.2 mmol/L (ref 3.5–5.3)
Sodium: 141 mmol/L (ref 135–146)

## 2015-05-07 LAB — HEPATIC FUNCTION PANEL
ALT: 23 U/L (ref 6–29)
AST: 29 U/L (ref 10–35)
Albumin: 4.1 g/dL (ref 3.6–5.1)
Alkaline Phosphatase: 67 U/L (ref 33–130)
Bilirubin, Direct: 0.1 mg/dL (ref <=0.2)
Indirect Bilirubin: 0.5 mg/dL (ref 0.2–1.2)
Total Bilirubin: 0.6 mg/dL (ref 0.2–1.2)
Total Protein: 6.9 g/dL (ref 6.1–8.1)

## 2015-05-07 NOTE — Patient Instructions (Addendum)
We will be checking the following labs today - BMET, HPF and Lipids   Medication Instructions:    Continue with your current medicines.     Testing/Procedures To Be Arranged:  N/A  Follow-Up:   See me in one year    Other Special Instructions:   Keep a check on your BP for me  Keep up the good work with losing weight!    If you need a refill on your cardiac medications before your next appointment, please call your pharmacy.   Call the [facility] office at [phone] if you have any questions, problems or concerns.

## 2015-05-07 NOTE — Progress Notes (Signed)
CARDIOLOGY OFFICE NOTE  Date:  05/07/2015    Kara Solomon Date of Birth: 05-25-1935 Medical Record #[id number]  PCP:  No PCP Per Patient  Cardiologist:  Mayford Knife    Chief Complaint  Patient presents with  . Chest Pain    5 month check - seen for Dr. Mayford Knife  . Hyperlipidemia  . Hypertension    History of Present Illness: Kara Solomon is a 80 y.o. female who presents today for a 5 month check.  Seen for Dr. Mayford Knife. Seen by me back in August of 2016 in the FLEX as a new patient. She had been referred by PCP for abnormal EKG and a brief episode of chest pain. BP was elevated and I ended up treating her HTN and got a stress test.   She was doing well at her last visit from August with me.   Comes in today. Here alone today. No chest pain or shortness of breath reported. Says her BP has been ok but she does not remember what her readings have been. Admits she gets a little anxious with coming to the office.  Her weight is down and she says she has cut back on her carbs/sugars. No medicines today. She is fasting today. She is happy with how she is doing.   Past Medical History  Diagnosis Date  . Cataract   . HTN (hypertension)   . Borderline hyperglycemia     Past Surgical History  Procedure Laterality Date  . Tonsillectomy       Medications: Current Outpatient Prescriptions  Medication Sig Dispense Refill  . amLODipine (NORVASC) 5 MG tablet Take 1 tablet (5 mg total) by mouth daily. 90 tablet 1  . lisinopril (PRINIVIL,ZESTRIL) 10 MG tablet Take 1 tablet (10 mg total) by mouth daily. 30 tablet 6  . Multiple Vitamins-Minerals (MULTIVITAMIN WITH MINERALS) tablet Take 1 tablet by mouth daily.     No current facility-administered medications for this visit.    Allergies: Allergies  Allergen Reactions  . Penicillins Hives    Social History: The patient  reports that she has never smoked. She does not have any smokeless tobacco history on file. She reports that she does  not drink alcohol or use illicit drugs.   Family History: The patient's family history includes Heart disease in her sister; Heart failure in her mother; Stroke (age of onset: 67) in her father.   Review of Systems: Please see the history of present illness.   Otherwise, the review of systems is positive for none.   All other systems are reviewed and negative.   Physical Exam: VS:  BP 150/80 mmHg  Pulse 64  Ht 5' 1.5" (1.562 m)  Wt 142 lb (64.411 kg)  BMI 26.40 kg/m2 .  BMI Body mass index is 26.4 kg/(m^2).  Wt Readings from Last 3 Encounters:  05/07/15 142 lb (64.411 kg)  12/10/14 151 lb 6.4 oz (68.675 kg)  12/05/14 152 lb (68.947 kg)    General: Pleasant. Well developed, well nourished and in no acute distress.  Neck: Supple, no JVD, carotid bruits, or masses noted.  Cardiac: Regular rate and rhythm. No murmurs, rubs, or gallops. No edema.  Respiratory:  Lungs are clear to auscultation bilaterally with normal work of breathing.  MS: No deformity or atrophy. Gait and ROM intact. Skin: Warm and dry. Color is normal.  Neuro:  Strength and sensation are intact and no gross focal deficits noted.  Psych: Alert, appropriate and with normal affect.  LABORATORY DATA:  EKG:  EKG is not ordered today.  Lab Results  Component Value Date   WBC 11.3* 11/21/2014   HGB 14.6 11/21/2014   HCT 44.5 11/21/2014   PLT 185 11/21/2014   GLUCOSE 132* 12/10/2014   CHOL 255* 12/05/2014   TRIG 195.0* 12/05/2014   HDL 43.40 12/05/2014   LDLCALC 173* 12/05/2014   ALT 48* 12/05/2014   AST 59* 12/05/2014   NA 140 12/10/2014   K 4.1 12/10/2014   CL 104 12/10/2014   CREATININE 0.74 12/10/2014   BUN 16 12/10/2014   CO2 25 12/10/2014   TSH 2.996 11/21/2014    BNP (last 3 results) No results for input(s): BNP in the last 8760 hours.  ProBNP (last 3 results) No results for input(s): PROBNP in the last 8760 hours.   Other Studies Reviewed Today:  Myoview Study Highlights from August  2016     Nuclear stress EF: 82%.  There was no ST segment deviation noted during stress.  The study is normal.  The left ventricular ejection fraction is hyperdynamic (>65%).  Normal study, no ischemia or infarction.    Echo Study Conclusions from August 2016  - Left ventricle: The cavity size was normal. Wall thickness was normal. Systolic function was normal. The estimated ejection fraction was in the range of 60% to 65%. Wall motion was normal; there were no regional wall motion abnormalities. Doppler parameters are consistent with abnormal left ventricular relaxation (grade 1 diastolic dysfunction).  Assessment/Plan:  1. Chest pain/wrist pain - resolved  2. Abnormal EKG - stable Myoview  3. HTN - reports good control at home - no meds yet today - will have her continue to monitor.   4. HLD - rechecking labs today. She has lost weight.          Current medicines are reviewed with the patient today.  The patient does not have concerns regarding medicines other than what has been noted above.  The following changes have been made:  See above.  Labs/ tests ordered today include:   No orders of the defined types were placed in this encounter.     Disposition:   FU with me in one year.   Patient is agreeable to this plan and will call if any problems develop in the interim.   Signed: Rosalio Macadamia, RN, ANP-C 05/07/2015 8:36 AM  [facility] [address] [place], [place]  [address] Phone: [phone] Fax: [phone]

## 2015-07-17 ENCOUNTER — Other Ambulatory Visit: Payer: Self-pay | Admitting: *Deleted

## 2015-07-17 MED ORDER — LISINOPRIL 10 MG PO TABS: 10.0000 mg | ORAL_TABLET | Freq: Every day | ORAL | Status: DC

## 2015-07-25 DIAGNOSIS — H2513 Age-related nuclear cataract, bilateral: Secondary | ICD-10-CM | POA: Diagnosis not present

## 2015-08-28 DIAGNOSIS — H2512 Age-related nuclear cataract, left eye: Secondary | ICD-10-CM | POA: Diagnosis not present

## 2015-09-18 DIAGNOSIS — H2512 Age-related nuclear cataract, left eye: Secondary | ICD-10-CM | POA: Diagnosis not present

## 2015-09-25 DIAGNOSIS — H2511 Age-related nuclear cataract, right eye: Secondary | ICD-10-CM | POA: Diagnosis not present

## 2016-02-03 DIAGNOSIS — Z961 Presence of intraocular lens: Secondary | ICD-10-CM | POA: Diagnosis not present

## 2016-05-06 ENCOUNTER — Ambulatory Visit: Payer: Medicare HMO | Admitting: Nurse Practitioner

## 2017-10-18 ENCOUNTER — Encounter: Payer: Self-pay | Admitting: Family Medicine

## 2017-10-18 ENCOUNTER — Ambulatory Visit (INDEPENDENT_AMBULATORY_CARE_PROVIDER_SITE_OTHER): Payer: Medicare HMO | Admitting: Family Medicine

## 2017-10-18 VITALS — BP 174/84 | HR 98 | Temp 98.7°F | Ht 61.5 in | Wt 137.0 lb

## 2017-10-18 DIAGNOSIS — I10 Essential (primary) hypertension: Secondary | ICD-10-CM

## 2017-10-18 DIAGNOSIS — E538 Deficiency of other specified B group vitamins: Secondary | ICD-10-CM | POA: Diagnosis not present

## 2017-10-18 DIAGNOSIS — R413 Other amnesia: Secondary | ICD-10-CM | POA: Diagnosis not present

## 2017-10-18 NOTE — Assessment & Plan Note (Addendum)
Montreal Cognitive Assessment administered today with score of 23/30 with points lost for delayed recall, naming and serial subtraction.  This would indicate mild cognitive impairment. Copy of testing to be scanned in.  >31 minutes spent administering, reviewing/interpreting results and reviewing with patient with recommendation as outlined below  Mild cognitive impairment based on MOCA however symptoms at home are more concerning for dementia. Will check for reversible causes including electrolyte abnormalities, thyroid d/o and B12 deficiency and RPR.   If lab testing normal they would like referral to neurology.

## 2017-10-18 NOTE — Patient Instructions (Signed)
Dementia Dementia means losing some of your brain ability. People with dementia may have problems with:  Memory.  Making decisions.  Behavior.  Speaking.  Thinking.  Solving problems.  Follow these instructions at home: Medicine  Take over-the-counter and prescription medicines only as told by your doctor.  Avoid taking medicines that can change how you think. These include pain or sleeping medicines. Lifestyle   Make healthy choices: ? Be active as told by your doctor. ? Do not use any tobacco products, such as cigarettes, chewing tobacco, and e-cigarettes. If you need help quitting, ask your doctor. ? Eat a healthy diet. ? When you get stressed, do something to help yourself relax. Your doctor can give you tips. ? Spend time with other people.  Drink enough fluid to keep your pee (urine) clear or pale yellow.  Make sure you get good sleep. Use these tips to help you get a good night's rest: ? Try not to take naps during the day. ? Keep your sleeping area dark and cool. ? In the few hours before you go to bed, try not to do any exercise. ? Try not to have foods and drinks with caffeine in the evening. General instructions  Talk with your doctor to figure out: ? What you need help with. ? What your safety needs are.  If you were given a bracelet that tracks your location, make sure to wear it.  Keep all follow-up visits as told by your doctor. This is important. Contact a doctor if:  You have any new problems.  You have problems with choking or swallowing.  You have any symptoms of a different sickness. Get help right away if:  You have a fever.  You feel mixed up (confused) or more mixed up than before.  You have new sleepiness.  You have sleepiness that gets worse.  You have a hard time staying awake.  You or your family members are worried for your safety. This information is not intended to replace advice given to you by your health care  provider. Make sure you discuss any questions you have with your health care provider. Document Released: 03/19/2008 Document Revised: 09/12/2015 Document Reviewed: 01/02/2015 Elsevier Interactive Patient Education  2018 [employer]  

## 2017-10-18 NOTE — Assessment & Plan Note (Signed)
BP is elevated however she is a bit frustrated being here today I will see her back in 2 weeks, if remains elevated I discussed with her that we'll need to restart medication.

## 2017-10-18 NOTE — Progress Notes (Addendum)
Kara Solomon - 82 y.o. female MRN [id number]  Date of birth: 01-17-1936  Subjective Chief Complaint  Patient presents with  . Establish Care    family concerned about HTN not being treated, also concerned about being more forgetful    HPI Kara Dawleylsie Clair is a 82 y.o. female with a history of HTN here today with her Husband and daughter to establish with new pcp.  They are concerned about worsening memory issues and her blood pressure.  -Memory loss:  Patient is here reluctantly at the urging of her family.  Reports that she has having increasing difficulty with short term memory recall and that she will often repeat herself.  She realizes she may be having some memory difficulty but does not always noticed when she has been forgetful.  She has quite a bit of difficulty with word finding.  [place] where she seemed overly anxious and unfamiliar with her surroundings, often not letting other family members out of her sight.  She has also been a bit more argumentative at times but denies any outbursts or behavioral issues.  She denies any family history of alzheimers or parkinsons.  Family has not noticed any issues with tremor or gait.  She denies headaches, history of stroke or tia or other neurological changes.   -HTN:  History of htn, prior tx with amlodipine and lisinopril however she stopped taking this.  Husband and daughter report that she told them that is made her feel "funny" and she felt dizzy while taking.  She does not recall this and is unsure why she quit taking.  She denies a high salt diet.  She has not had chest pain, shortness of breath, edema, headache or increased vision changes.   ROS:  ROS completed and negative except as noted per HPI.   Allergies  Allergen Reactions  . Penicillins Hives    Past Medical History:  Diagnosis Date  . Borderline hyperglycemia   . Cataract   . HTN (hypertension)      Past Surgical History:  Procedure Laterality Date  . TONSILLECTOMY      Social History   Socioeconomic History  . Marital status: Married    Spouse name: Not on file  . Number of children: Not on file  . Years of education: Not on file  . Highest education level: Not on file  Occupational History  . Not on file  Social Needs  . Financial resource strain: Not on file  . Food insecurity:    Worry: Not on file    Inability: Not on file  . Transportation needs:    Medical: Not on file    Non-medical: Not on file  Tobacco Use  . Smoking status: Never Smoker  Substance and Sexual Activity  . Alcohol use: No    Alcohol/week: 0.0 oz  . Drug use: No  . Sexual activity: Not on file  Lifestyle  . Physical activity:    Days per week: Not on file    Minutes per session: Not on file  . Stress: Not on file  Relationships  . Social connections:    Talks on phone: Not on file    Gets together: Not on file    Attends religious service: Not on file    Active member of club or organization: Not on file    Attends meetings of clubs or organizations: Not on file    Relationship  status: Not on file  Other Topics Concern  . Not on file  Social History Narrative  . Not on file    Family History  Problem Relation Age of Onset  . Heart disease Sister   . Heart failure Mother        died in early 21s  . Stroke Father 36    Health Maintenance  Topic Date Due  . Janet Berlin  08/27/1956  . DEXA SCAN  08/28/2002  . PNA vac Low Risk Adult (1 of 2 - PCV13) 08/28/2002  . INFLUENZA VACCINE  11/18/2017    ----------------------------------------------------------------------------------------------------------------------------------------------------------------------------------------------------------------- Physical Exam BP (!) 174/84 (BP Location: Left Arm, Patient Position: Sitting, Cuff Size: Normal)   Pulse 98   Temp 98.7 F (37.1 C) (Oral)   Ht 5' 1.5" (1.562 m)    Wt 137 lb (62.1 kg)   SpO2 98%   BMI 25.47 kg/m   Physical Exam  Constitutional: She is oriented to person, place, and time. She appears well-nourished. No distress.  HENT:  Head: Normocephalic and atraumatic.  Mouth/Throat: Oropharynx is clear and moist.  Eyes: No scleral icterus.  Neck: Normal range of motion. Neck supple. No thyromegaly present.  Cardiovascular: Normal rate, regular rhythm and normal heart sounds.  Pulmonary/Chest: Effort normal and breath sounds normal.  Musculoskeletal: She exhibits no edema.  Lymphadenopathy:    She has no cervical adenopathy.  Neurological: She is alert and oriented to person, place, and time. She displays normal reflexes. No cranial nerve deficit or sensory deficit. She exhibits normal muscle tone. Coordination normal.  Montreal Cognitive Assessment [facility]): 23/30 with points lost for delayed recall, naming and serial subtractions.    Skin: Skin is warm and dry. No rash noted.  Psychiatric: She has a normal mood and affect. Her behavior is normal. Judgment and thought content normal.    ------------------------------------------------------------------------------------------------------------------------------------------------------------------------------------------------------------------- Assessment and Plan  Memory impairment Montreal Cognitive Assessment administered today with score of 23/30 with points lost for delayed recall, naming and serial subtraction.  This would indicate mild cognitive impairment. Copy of testing to be scanned in.  >31 minutes spent administering, reviewing/interpreting results and reviewing with patient with recommendation as outlined below  Mild cognitive impairment based on MOCA however symptoms at home are more concerning for dementia. Will check for reversible causes including electrolyte abnormalities, thyroid d/o and B12 deficiency and RPR.   If lab testing normal they would like referral to neurology.     Essential hypertension BP is elevated however she is a bit frustrated being here today I will see her back in 2 weeks, if remains elevated I discussed with her that we'll need to restart medication.

## 2017-10-19 LAB — BASIC METABOLIC PANEL
BUN: 17 mg/dL (ref 6–23)
CO2: 25 mEq/L (ref 19–32)
Calcium: 9.6 mg/dL (ref 8.4–10.5)
Chloride: 104 mEq/L (ref 96–112)
Creatinine, Ser: 0.73 mg/dL (ref 0.40–1.20)
GFR: 81.5 mL/min (ref >60.00)
Glucose, Bld: 92 mg/dL (ref 70–99)
Potassium: 4 mEq/L (ref 3.5–5.1)
Sodium: 139 mEq/L (ref 135–145)

## 2017-10-19 LAB — VITAMIN B12: VITAMIN B 12: 224 pg/mL (ref 211–911)

## 2017-10-19 LAB — RPR: RPR Ser Ql: NONREACTIVE

## 2017-10-19 LAB — TSH: TSH: 2.8 u[IU]/mL (ref 0.35–4.50)

## 2017-10-20 MED ORDER — CYANOCOBALAMIN 1000 MCG/ML IJ SOLN
1000.0000 ug | INTRAMUSCULAR | Status: DC
  Administered 2017-11-01 – 2018-03-10 (×4): 1000 ug via INTRAMUSCULAR

## 2017-10-20 NOTE — Progress Notes (Signed)
-  B12 levels are low, recommend starting monthly injections to see if this helps with memory.  Orders entered.   -Other labs are normal.

## 2017-10-20 NOTE — Addendum Note (Signed)
Addended by: Mammie LorenzoMATTHEWS, Dazhane Villagomez E on: 10/20/2017 09:40 AM   Modules accepted: Orders

## 2017-11-01 ENCOUNTER — Ambulatory Visit (INDEPENDENT_AMBULATORY_CARE_PROVIDER_SITE_OTHER): Payer: Medicare HMO | Admitting: Family Medicine

## 2017-11-01 ENCOUNTER — Encounter: Payer: Self-pay | Admitting: Family Medicine

## 2017-11-01 DIAGNOSIS — R413 Other amnesia: Secondary | ICD-10-CM

## 2017-11-01 DIAGNOSIS — E538 Deficiency of other specified B group vitamins: Secondary | ICD-10-CM | POA: Diagnosis not present

## 2017-11-01 DIAGNOSIS — I10 Essential (primary) hypertension: Secondary | ICD-10-CM

## 2017-11-01 MED ORDER — AMLODIPINE BESYLATE 5 MG PO TABS: 5.0000 mg | ORAL_TABLET | Freq: Every day | ORAL | 1 refills | Status: DC

## 2017-11-01 NOTE — Patient Instructions (Signed)
Start amlodipine 5mg  daily Check blood pressure if possible at home  I will see you back in about 1 month, we'll repeat injection and follow up blood pressure at that time.

## 2017-11-01 NOTE — Progress Notes (Signed)
Kara Solomon - 82 y.o. female MRN [id number]  Date of birth: 18-Jul-1935  Subjective Chief Complaint  Patient presents with  . Follow-up    HPI Kara Solomon is a 82 y.o. female here today for follow up of hypertension and memory loss.  She reports she is doing well.  Blood pressure continues to be elevated at today's visit.  Previous treatment with amlodipine and lisinopril but had side effects with taking both medications.  She is willing to restart one of these.  She denies chest pain, shortness of breath, palpitations, headache or vision changes.    In regards to memory loss her b12 levels returned low and she is planning on starting monthly b12 injections.  She does report some increased anxiety as her husband was recently diagnosed with atrial flutter.  Reports some occasional feeling of sadness but doesn't feel that she is depressed.    ROS:  ROS completed and negative except as noted per HPI Allergies  Allergen Reactions  . Penicillins Hives    Past Medical History:  Diagnosis Date  . Borderline hyperglycemia   . Cataract   . HTN (hypertension)     Past Surgical History:  Procedure Laterality Date  . TONSILLECTOMY      Social History   Socioeconomic History  . Marital status: Married    Spouse name: Not on file  . Number of children: Not on file  . Years of education: Not on file  . Highest education level: Not on file  Occupational History  . Not on file  Social Needs  . Financial resource strain: Not on file  . Food insecurity:    Worry: Not on file    Inability: Not on file  . Transportation needs:    Medical: Not on file    Non-medical: Not on file  Tobacco Use  . Smoking status: Never Smoker  Substance and Sexual Activity  . Alcohol use: No    Alcohol/week: 0.0 oz  . Drug use: No  . Sexual activity: Not on file  Lifestyle  . Physical activity:    Days per week: Not on file    Minutes per session: Not on file  . Stress: Not on file  Relationships   . Social connections:    Talks on phone: Not on file    Gets together: Not on file    Attends religious service: Not on file    Active member of club or organization: Not on file    Attends meetings of clubs or organizations: Not on file    Relationship status: Not on file  Other Topics Concern  . Not on file  Social History Narrative  . Not on file    Family History  Problem Relation Age of Onset  . Heart disease Sister   . Heart failure Mother        died in early 52s  . Stroke Father 41    Health Maintenance  Topic Date Due  . Janet Berlin  08/27/1956  . DEXA SCAN  08/28/2002  . PNA vac Low Risk Adult (1 of 2 - PCV13) 08/28/2002  . INFLUENZA VACCINE  11/18/2017    ----------------------------------------------------------------------------------------------------------------------------------------------------------------------------------------------------------------- Physical Exam BP (!) 156/86 (BP Location: Left Arm, Patient Position: Sitting, Cuff Size: Normal)   Pulse 62   Temp 98.1 F (36.7 C) (Oral)   Ht 5' 1.5" (1.562 m)   Wt 137 lb 9.6 oz (62.4 kg)   SpO2 96%   BMI 25.58 kg/m   Physical Exam  Constitutional: She is oriented to person, place, and time. She appears well-nourished. No distress.  HENT:  Head: Normocephalic and atraumatic.  Eyes: No scleral icterus.  Cardiovascular: Normal rate and regular rhythm.  Musculoskeletal: She exhibits no edema.  Neurological: She is alert and oriented to person, place, and time.  Skin: Skin is warm and dry. No rash noted.  Psychiatric: She has a normal mood and affect. Her behavior is normal.    ------------------------------------------------------------------------------------------------------------------------------------------------------------------------------------------------------------------- Assessment and Plan  Essential hypertension BP remains uncontrolled.  Restart amlodipine 5mg  Check BP  at home if possible Follow low salt diet F/u in 4 weeks.  Memory impairment First B12 injection given today Repeat monthly, recheck levels again in 6 months.

## 2017-11-01 NOTE — Assessment & Plan Note (Signed)
BP remains uncontrolled.  Restart amlodipine 5mg  Check BP at home if possible Follow low salt diet F/u in 4 weeks.

## 2017-11-01 NOTE — Assessment & Plan Note (Signed)
First B12 injection given today Repeat monthly, recheck levels again in 6 months.

## 2017-12-03 ENCOUNTER — Ambulatory Visit: Payer: Medicare HMO | Admitting: Family Medicine

## 2017-12-06 ENCOUNTER — Ambulatory Visit: Payer: Medicare HMO | Admitting: Family Medicine

## 2017-12-08 ENCOUNTER — Encounter: Payer: Self-pay | Admitting: Family Medicine

## 2017-12-08 ENCOUNTER — Ambulatory Visit (INDEPENDENT_AMBULATORY_CARE_PROVIDER_SITE_OTHER): Payer: Medicare HMO | Admitting: Family Medicine

## 2017-12-08 VITALS — BP 140/70 | HR 72 | Temp 98.1°F | Ht 61.5 in | Wt 134.6 lb

## 2017-12-08 DIAGNOSIS — F4322 Adjustment disorder with anxiety: Secondary | ICD-10-CM | POA: Diagnosis not present

## 2017-12-08 DIAGNOSIS — I10 Essential (primary) hypertension: Secondary | ICD-10-CM

## 2017-12-08 DIAGNOSIS — E538 Deficiency of other specified B group vitamins: Secondary | ICD-10-CM | POA: Diagnosis not present

## 2017-12-08 DIAGNOSIS — R69 Illness, unspecified: Secondary | ICD-10-CM | POA: Diagnosis not present

## 2017-12-08 DIAGNOSIS — R413 Other amnesia: Secondary | ICD-10-CM

## 2017-12-08 NOTE — Assessment & Plan Note (Signed)
BP improved today, acceptable for age and other risk factors.  Continue current medications

## 2017-12-08 NOTE — Patient Instructions (Signed)
I will see you back in about 3-4 months or sooner if needed.

## 2017-12-08 NOTE — Assessment & Plan Note (Signed)
Stable at this time She will continue B12 injections for now.  F/u 3-4 months.

## 2017-12-08 NOTE — Progress Notes (Signed)
Kara Solomon - 82 y.o. female MRN [id number]  Date of birth: [birth year]  Subjective Chief Complaint  Patient presents with  . Follow-up    pt here for b12 injection and bp check, pt has not been checking bp at home, pt has been feeling good.     HPI Kara Solomon is a 82 y.o. female with history of HTN here today for f/u of HTN and memory loss.  She is accompanied by her daughter today.  Unfortunately her husband was just diagnosed with Stage 4 pancreatic cancer and is in the hospital for pneumonia vs chf.  She feels a bit stressed and a little more anxious.  She has been using melatonin occasionally for sleep which is helpful.  She is compliant with amlodipine for blood pressure but is not monitoring BP at home.  She has not noticed any difference yet with B12 injections. Her daughter reports memory is stable at this time.  She denies chest pain, shortness of breath, palpitations, headache or vision change.   ROS:  A comprehensive ROS was completed and negative except as noted per HPI  Allergies  Allergen Reactions  . Penicillins Hives    Past Medical History:  Diagnosis Date  . Borderline hyperglycemia   . Cataract   . HTN (hypertension)     Past Surgical History:  Procedure Laterality Date  . TONSILLECTOMY      Social History   Socioeconomic History  . Marital status: Married    Spouse name: Not on file  . Number of children: Not on file  . Years of education: Not on file  . Highest education level: Not on file  Occupational History  . Not on file  Social Needs  . Financial resource strain: Not on file  . Food insecurity:    Worry: Not on file    Inability: Not on file  . Transportation needs:    Medical: Not on file    Non-medical: Not on file  Tobacco Use  . Smoking status: Never Smoker  . Smokeless tobacco: Never Used  Substance and Sexual Activity  . Alcohol use: No    Alcohol/week: 0.0 standard drinks  . Drug use: No  . Sexual activity: Not on file    Lifestyle  . Physical activity:    Days per week: Not on file    Minutes per session: Not on file  . Stress: Not on file  Relationships  . Social connections:    Talks on phone: Not on file    Gets together: Not on file    Attends religious service: Not on file    Active member of club or organization: Not on file    Attends meetings of clubs or organizations: Not on file    Relationship status: Not on file  Other Topics Concern  . Not on file  Social History Narrative  . Not on file    Family History  Problem Relation Age of Onset  . Heart disease Sister   . Heart failure Mother        died in early 7380s  . Stroke Father [id number]    Health Maintenance  Topic Date Due  . Janet BerlinETANUS/TDAP  08/27/1956  . DEXA SCAN  08/28/2002  . PNA vac Low Risk Adult (1 of 2 - PCV13) 08/28/2002  . INFLUENZA VACCINE  11/18/2017    ----------------------------------------------------------------------------------------------------------------------------------------------------------------------------------------------------------------- Physical Exam BP 140/70 (BP Location: Right Arm, Patient Position: Sitting, Cuff Size: Normal)   Pulse 72   Temp 98.1 F (  36.7 C) (Oral)   Ht 5' 1.5" (1.562 m)   Wt 134 lb 9.6 oz (61.1 kg)   SpO2 98%   BMI 25.02 kg/m   Physical Exam  Constitutional: She is oriented to person, place, and time. She appears well-nourished. No distress.  HENT:  Head: Normocephalic and atraumatic.  Eyes: No scleral icterus.  Neck: Normal range of motion. Neck supple. No thyromegaly present.  Cardiovascular: Normal rate, regular rhythm and normal heart sounds.  Pulmonary/Chest: Effort normal and breath sounds normal.  Musculoskeletal: She exhibits no edema.  Neurological: She is alert and oriented to person, place, and time. No cranial nerve deficit. Coordination normal.  Skin: Skin is warm and dry. No rash noted.  Psychiatric: Her behavior is normal. Her mood appears anxious.     ------------------------------------------------------------------------------------------------------------------------------------------------------------------------------------------------------------------- Assessment and Plan  Essential hypertension BP improved today, acceptable for age and other risk factors.  Continue current medications   Adjustment reaction with anxiety Husband with recent dx of Stage IV pancreatic cancer which has created some anxiety for her.  Managing fairly well, using melatonin on occasion to help with sleep.  Ok to continue this.  She will let me know of any worsening symptoms.   Memory impairment Stable at this time She will continue B12 injections for now.  F/u 3-4 months.

## 2017-12-08 NOTE — Assessment & Plan Note (Signed)
Husband with recent dx of Stage IV pancreatic cancer which has created some anxiety for her.  Managing fairly well, using melatonin on occasion to help with sleep.  Ok to continue this.  She will let me know of any worsening symptoms.

## 2018-01-11 ENCOUNTER — Ambulatory Visit (INDEPENDENT_AMBULATORY_CARE_PROVIDER_SITE_OTHER): Payer: Medicare HMO

## 2018-01-11 ENCOUNTER — Encounter: Payer: Self-pay | Admitting: Family Medicine

## 2018-01-11 DIAGNOSIS — E538 Deficiency of other specified B group vitamins: Secondary | ICD-10-CM | POA: Diagnosis not present

## 2018-01-11 NOTE — Progress Notes (Addendum)
Pt presented with daughter for B12 injection per order from Dr Ashley RoyaltyMatthews. IM injection given in L deltoid and pt tolerated well. No S/S observed prior to leaving office. Instructed to return in 30 days

## 2018-01-18 ENCOUNTER — Other Ambulatory Visit: Payer: Self-pay

## 2018-01-18 ENCOUNTER — Encounter (HOSPITAL_COMMUNITY): Payer: Self-pay | Admitting: Emergency Medicine

## 2018-01-18 ENCOUNTER — Emergency Department (HOSPITAL_COMMUNITY)
Admission: EM | Admit: 2018-01-18 | Discharge: 2018-01-19 | Disposition: A | Discharge status: Home / Self Care | Payer: Medicare HMO | Attending: Emergency Medicine | Admitting: Emergency Medicine

## 2018-01-18 ENCOUNTER — Emergency Department (HOSPITAL_COMMUNITY): Payer: Medicare HMO

## 2018-01-18 DIAGNOSIS — R2681 Unsteadiness on feet: Secondary | ICD-10-CM | POA: Diagnosis not present

## 2018-01-18 DIAGNOSIS — R69 Illness, unspecified: Secondary | ICD-10-CM | POA: Diagnosis not present

## 2018-01-18 DIAGNOSIS — R269 Unspecified abnormalities of gait and mobility: Secondary | ICD-10-CM | POA: Diagnosis not present

## 2018-01-18 DIAGNOSIS — Z634 Disappearance and death of family member: Secondary | ICD-10-CM | POA: Diagnosis not present

## 2018-01-18 DIAGNOSIS — N3001 Acute cystitis with hematuria: Secondary | ICD-10-CM | POA: Insufficient documentation

## 2018-01-18 DIAGNOSIS — Z79899 Other long term (current) drug therapy: Secondary | ICD-10-CM | POA: Insufficient documentation

## 2018-01-18 DIAGNOSIS — R42 Dizziness and giddiness: Secondary | ICD-10-CM | POA: Diagnosis not present

## 2018-01-18 DIAGNOSIS — I10 Essential (primary) hypertension: Secondary | ICD-10-CM | POA: Insufficient documentation

## 2018-01-18 LAB — CBC
HEMATOCRIT: 44 % (ref 36.0–46.0)
Hemoglobin: 14.6 g/dL (ref 12.0–15.0)
MCH: 30.5 pg (ref 26.0–34.0)
MCHC: 33.2 g/dL (ref 30.0–36.0)
MCV: 92.1 fL (ref 78.0–100.0)
PLATELETS: 184 10*3/uL (ref 150–400)
RBC: 4.78 MIL/uL (ref 3.87–5.11)
RDW: 13.9 % (ref 11.5–15.5)
WBC: 11.4 10*3/uL — ABNORMAL HIGH (ref 4.0–10.5)

## 2018-01-18 LAB — BASIC METABOLIC PANEL
ANION GAP: 11 (ref 5–15)
BUN: 17 mg/dL (ref 8–23)
CO2: 23 mmol/L (ref 22–32)
Calcium: 9.6 mg/dL (ref 8.9–10.3)
Chloride: 108 mmol/L (ref 98–111)
Creatinine, Ser: 0.69 mg/dL (ref 0.44–1.00)
GLUCOSE: 113 mg/dL — AB (ref 70–99)
POTASSIUM: 3.8 mmol/L (ref 3.5–5.1)
SODIUM: 142 mmol/L (ref 135–145)

## 2018-01-18 LAB — CBG MONITORING, ED: GLUCOSE-CAPILLARY: 113 mg/dL — AB (ref 70–99)

## 2018-01-18 NOTE — ED Triage Notes (Signed)
Pt from home with c/o difficulty walking in the mornings x 2 days. Pt denies CP SOB. Pt states after the morning symptoms dissipate. Pt states she is unsure if it is leg heaviness or dizziness that she feels. Pt denies more frequent urination. Pt denies fever/chills. Pt states she lost her husband 5 weeks ago. Normal neuro exam. No nystagmus noted. Slight lightheadedness noted with postural changes, but symptoms decrease quickly

## 2018-01-18 NOTE — ED Notes (Signed)
Pt ambulated to the BR with steady gait.   

## 2018-01-18 NOTE — ED Provider Notes (Signed)
[facility]-EMERGENCY DEPT Provider Note   CSN: [id number] Arrival date & time: 01/18/18  1949     History   Chief Complaint Chief Complaint  Patient presents with  . Dizziness    HPI Kara Solomon is a 82 y.o. female.  HPI  This is an 82 year old female with history of hypertension who presents with gait disturbance.  Patient reports for the last 2 mornings she is gotten up first thing in the morning to go to the bathroom and has had difficulty ambulating in a straight line.  She states that she did not necessarily feel dizzy but felt like she was listing to the right and to the left.  She states this lasted for some time but got better throughout the day.  Her daughter did not notice any gait disturbance yesterday.  However, this morning when she got up she felt so unsteady that she crawled to the bathroom.  Daughter has noted only that she has required a little bit of an assistance to steady herself.  No facial droop, speech disturbance, focal weakness or numbness noted.  Patient without history of stroke or A. fib.  She lives independently.  She does report that she recently lost her husband.  No recent changes in medication.  She denies alcohol or drug use.  She states that time she has pinprick chest discomfort.  It has been sometime since she has had this.  No chest pain in the last 2 days.  Denies shortness of breath.  Past Medical History:  Diagnosis Date  . Borderline hyperglycemia   . Cataract   . HTN (hypertension)     Patient Active Problem List   Diagnosis Date Noted  . Adjustment reaction with anxiety 12/08/2017  . Memory impairment 10/18/2017  . Essential hypertension 10/18/2017    Past Surgical History:  Procedure Laterality Date  . TONSILLECTOMY       OB History   None      Home Medications    Prior to Admission medications   Medication Sig Start Date End Date Taking? Authorizing Provider  amLODipine (NORVASC) 5 MG tablet Take 1  tablet (5 mg total) by mouth daily. 11/01/17  Yes Everrett Coombe, DO  Cyanocobalamin (VITAMIN B-12 IJ) Inject 1 each as directed every 30 (thirty) days.   Yes [provider]  Melatonin 5 MG TABS Take 5 mg by mouth at bedtime.   Yes [provider]  Multiple Vitamin (MULTIVITAMIN WITH MINERALS) TABS tablet Take 1 tablet by mouth daily.   Yes [provider]  cephALEXin (KEFLEX) 500 MG capsule Take 1 capsule (500 mg total) by mouth 3 (three) times daily. 01/19/18   Desa Rech, Mayer Masker, MD    Family History Family History  Problem Relation Age of Onset  . Heart disease Sister   . Heart failure Mother        died in early 38s  . Stroke Father 27    Social History Social History   Tobacco Use  . Smoking status: Never Smoker  . Smokeless tobacco: Never Used  Substance Use Topics  . Alcohol use: No    Alcohol/week: 0.0 standard drinks  . Drug use: No     Allergies   Penicillins   Review of Systems Review of Systems  Constitutional: Negative for fever.  Respiratory: Negative for shortness of breath.   Cardiovascular: Negative for chest pain.  Gastrointestinal: Negative for abdominal pain, nausea and vomiting.  Genitourinary: Positive for dysuria.  Musculoskeletal: Positive for  gait problem. Negative for back pain.  Neurological: Negative for dizziness, speech difficulty, weakness, light-headedness and numbness.  All other systems reviewed and are negative.    Physical Exam Updated Vital Signs BP (!) 169/83 (BP Location: Left Arm)   Pulse 96   Temp 98.3 F (36.8 C) (Oral)   Resp 20   SpO2 92%   Physical Exam  Constitutional: She is oriented to person, place, and time. She appears well-developed and well-nourished.  HENT:  Head: Normocephalic and atraumatic.  Eyes: Pupils are equal, round, and reactive to light.  Neck: Neck supple.  Cardiovascular: Normal rate, regular rhythm and normal heart sounds.  Pulmonary/Chest: Effort normal and  breath sounds normal. No respiratory distress. She has no wheezes.  Abdominal: Soft. Bowel sounds are normal. There is no tenderness.  Neurological: She is alert and oriented to person, place, and time.  Cranial nerves II through XII intact, 5 out of 5 strength in all 4 extremities, no dysmetria to finger-nose-finger, guarded gait but no ataxia noted, patient is able to tandem gait  Skin: Skin is warm and dry.  Psychiatric: She has a normal mood and affect.  Nursing note and vitals reviewed.    ED Treatments / Results  Labs (all labs ordered are listed, but only abnormal results are displayed) Labs Reviewed  BASIC METABOLIC PANEL - Abnormal; Notable for the following components:      Result Value   Glucose, Bld 113 (*)    All other components within normal limits  CBC - Abnormal; Notable for the following components:   WBC 11.4 (*)    All other components within normal limits  URINALYSIS, ROUTINE W REFLEX MICROSCOPIC - Abnormal; Notable for the following components:   APPearance HAZY (*)    Nitrite POSITIVE (*)    Leukocytes, UA LARGE (*)    RBC / HPF >50 (*)    WBC, UA >50 (*)    Bacteria, UA MANY (*)    All other components within normal limits  CBG MONITORING, ED - Abnormal; Notable for the following components:   Glucose-Capillary 113 (*)    All other components within normal limits  URINE CULTURE  TROPONIN I    EKG EKG Interpretation  Date/Time:  Wednesday January 19 2018 00:09:01 EDT Ventricular Rate:  71 PR Interval:    QRS Duration: 89 QT Interval:  411 QTC Calculation: 447 R Axis:   0 Text Interpretation:  Sinus rhythm Probable left atrial enlargement Low voltage, precordial leads Consider anterior infarct Confirmed by Ross Marcus ([id number]) on 01/19/2018 1:26:50 AM   Radiology Ct Head Wo Contrast  Result Date: 01/19/2018 CLINICAL DATA:  Dizziness EXAM: CT HEAD WITHOUT CONTRAST TECHNIQUE: Contiguous axial images were obtained from the base of the skull  through the vertex without intravenous contrast. COMPARISON:  None. FINDINGS: Brain: No evidence of acute infarction, hemorrhage, hydrocephalus, extra-axial collection or mass lesion/mass effect. Vascular: No hyperdense vessel or unexpected calcification. Skull: Normal. Negative for fracture or focal lesion. Sinuses/Orbits: The visualized paranasal sinuses are essentially clear. The mastoid air cells are unopacified. Other: None. IMPRESSION: Normal head CT. Electronically Signed   By: Charline Bills M.D.   On: 01/19/2018 00:11    Procedures Procedures (including critical care time)  Medications Ordered in ED Medications  cefTRIAXone (ROCEPHIN) 1 g in sodium chloride 0.9 % 100 mL IVPB (0 g Intravenous Stopped 01/19/18 0144)     Initial Impression / Assessment and Plan / ED Course  I have reviewed the triage vital signs and  the nursing notes.  Pertinent labs & imaging results that were available during my care of the patient were reviewed by me and considered in my medical decision making (see chart for details).     She presents with gait instability for the last 2 days.  Reports that it is worse in the morning but improves throughout the day.  She denies any significant dizziness.  Her vital signs are reassuring.  She is afebrile.  Her neurologic exam is completely intact.  No evidence of ataxia with gait.  She does not require assistance.  She can tandem gait.  While TIA is a consideration, it is quite atypical that her symptoms would return at the same time in the day and improved.  Also she has had prolonged symptoms in the morning.  Only other symptoms that she endorses some urinary symptoms.  CT head is negative.  Basic lab work-up is largely reassuring.  Urinalysis does show a nitrite positive urine with greater than 50 white cells and many bacteria.  Given her age, urinary tract infection could cause her gait instability.  She is clinically stable and does not appear septic.  I discussed  with the patient and her daughter that I felt that this may be the cause of her symptoms given her reassuring neurologic exam.  However, I could not fully rule out TIA.  We discussed options.  Feel it is reasonable to treat her urinary tract infection and have her follow-up closely with her primary physician.  At this time do not feel she needs an acute TIA work-up; however, she was instructed that if she has any new or recurrent symptoms she needs to be reevaluated immediately.  Patient stated understanding and was agreeable to plan.  I did offer her admission but she feels comfortable going home.  This seems reasonable.  Patient was given 1 dose of IV Rocephin.  Will discharge on Keflex.  Urine cultures pending.  After history, exam, and medical workup I feel the patient has been appropriately medically screened and is safe for discharge home. Pertinent diagnoses were discussed with the patient. Patient was given return precautions.   Final Clinical Impressions(s) / ED Diagnoses   Final diagnoses:  Acute cystitis with hematuria  Gait instability    ED Discharge Orders         Ordered    cephALEXin (KEFLEX) 500 MG capsule  3 times daily     01/19/18 0153           Meko Masterson, Mayer Masker, MD 01/19/18 [phone]

## 2018-01-19 DIAGNOSIS — N3001 Acute cystitis with hematuria: Secondary | ICD-10-CM | POA: Diagnosis not present

## 2018-01-19 DIAGNOSIS — R2681 Unsteadiness on feet: Secondary | ICD-10-CM | POA: Diagnosis not present

## 2018-01-19 DIAGNOSIS — Z79899 Other long term (current) drug therapy: Secondary | ICD-10-CM | POA: Diagnosis not present

## 2018-01-19 DIAGNOSIS — R69 Illness, unspecified: Secondary | ICD-10-CM | POA: Diagnosis not present

## 2018-01-19 DIAGNOSIS — I10 Essential (primary) hypertension: Secondary | ICD-10-CM | POA: Diagnosis not present

## 2018-01-19 LAB — TROPONIN I: Troponin I: <0.03 ng/mL (ref <0.03)

## 2018-01-19 LAB — URINALYSIS, ROUTINE W REFLEX MICROSCOPIC
Bilirubin Urine: NEGATIVE
GLUCOSE, UA: NEGATIVE mg/dL
Hgb urine dipstick: NEGATIVE
KETONES UR: NEGATIVE mg/dL
Nitrite: POSITIVE — AB
PH: 5 (ref 5.0–8.0)
Protein, ur: NEGATIVE mg/dL
RBC / HPF: >50 RBC/hpf — ABNORMAL HIGH (ref 0–5)
Specific Gravity, Urine: 1.02 (ref 1.005–1.030)
WBC, UA: >50 WBC/hpf — ABNORMAL HIGH (ref 0–5)

## 2018-01-19 MED ORDER — CEPHALEXIN 500 MG PO CAPS: 500.0000 mg | ORAL_CAPSULE | Freq: Three times a day (TID) | ORAL | 0 refills | Status: DC

## 2018-01-19 MED ORDER — SODIUM CHLORIDE 0.9 % IV SOLN
1.0000 g | Freq: Once | INTRAVENOUS | Status: AC
  Administered 2018-01-19: 1 g via INTRAVENOUS
  Filled 2018-01-19: qty 10

## 2018-01-19 NOTE — ED Notes (Signed)
EKG given to EDP,Rees,MD., for review. 

## 2018-01-19 NOTE — Discharge Instructions (Addendum)
You were seen today for gait instability.  Your work-up is notable for an acute urinary tract infection.  This can often times make people feel dizzy or off balance.  Your work-up is otherwise reassuring.  You will be started on antibiotics.  If you continue to have gait instability after treatment, develop any weakness, new numbness, speech difficulty, fevers or any worsening symptoms you should be reevaluated immediately.  Follow-up with your primary physician in 2 days.

## 2018-01-20 ENCOUNTER — Ambulatory Visit (INDEPENDENT_AMBULATORY_CARE_PROVIDER_SITE_OTHER): Payer: Medicare HMO | Admitting: Family Medicine

## 2018-01-20 ENCOUNTER — Encounter: Payer: Self-pay | Admitting: Family Medicine

## 2018-01-20 VITALS — BP 140/70 | HR 96 | Temp 98.7°F | Ht 61.5 in | Wt 131.2 lb

## 2018-01-20 DIAGNOSIS — F4322 Adjustment disorder with anxiety: Secondary | ICD-10-CM

## 2018-01-20 DIAGNOSIS — N3090 Cystitis, unspecified without hematuria: Secondary | ICD-10-CM | POA: Diagnosis not present

## 2018-01-20 DIAGNOSIS — R69 Illness, unspecified: Secondary | ICD-10-CM | POA: Diagnosis not present

## 2018-01-20 NOTE — Assessment & Plan Note (Signed)
-  Related to recent loss of husband -has good support from family and has found comfort in adopting a cat.  -Not interested in counseling currently but will let me know if anything changes.

## 2018-01-20 NOTE — Progress Notes (Signed)
Kara Solomon - 82 y.o. female MRN [id number]  Date of birth: December 14, 1935  Subjective Chief Complaint  Patient presents with  . Hospitalization Follow-up    HPI Kara Solomon is a 82 y.o. female here today for ER follow up.  She was recently seen in ER for changes in gait and coordination and found to have a UTI.  Started on cephalexin yesterday and reports that she is feeling better today.  She denies any further issues with coordination or gait instability and she does not have any urinary symptoms, fever, chills or flank pain. .  Daughter denies any confusion.  Since her last visit with me her husband of 60 years,  Theodoro Grist passed away.  This has been a big adjustment for her.  She tells me they did nearly everything together including preparing meals and eating.  She has had decreased appetite since he passed away and states that "food just doesn't taste good sometimes".  She does have good support from her daughters, one of which lives locally and is also a widow Dois Davenport).  They will often go out to eat or do other activities.  Her other daughter Lawson Fiscal) lives about 1.5 hours away and also visits often.  She also recently adopted a cat (Lola) and has found good support from her.    She doesn't think that she needs counseling at this time.   ROS:  A comprehensive ROS was completed and negative except as noted per HPI  Allergies  Allergen Reactions  . Penicillins Hives    Has patient had a PCN reaction causing immediate rash, facial/tongue/throat swelling, SOB or lightheadedness with hypotension: Unknown Has patient had a PCN reaction causing severe rash involving mucus membranes or skin necrosis: Unknown Has patient had a PCN reaction that required hospitalization: Unknown Has patient had a PCN reaction occurring within the last 10 years: No If all of the above answers are "NO", then may proceed with Cephalosporin use.     Past Medical History:  Diagnosis Date  . Borderline hyperglycemia   .  Cataract   . HTN (hypertension)     Past Surgical History:  Procedure Laterality Date  . TONSILLECTOMY      Social History   Socioeconomic History  . Marital status: Married    Spouse name: Not on file  . Number of children: Not on file  . Years of education: Not on file  . Highest education level: Not on file  Occupational History  . Not on file  Social Needs  . Financial resource strain: Not on file  . Food insecurity:    Worry: Not on file    Inability: Not on file  . Transportation needs:    Medical: Not on file    Non-medical: Not on file  Tobacco Use  . Smoking status: Never Smoker  . Smokeless tobacco: Never Used  Substance and Sexual Activity  . Alcohol use: No    Alcohol/week: 0.0 standard drinks  . Drug use: No  . Sexual activity: Not on file  Lifestyle  . Physical activity:    Days per week: Not on file    Minutes per session: Not on file  . Stress: Not on file  Relationships  . Social connections:    Talks on phone: Not on file    Gets together: Not on file    Attends religious service: Not on file    Active member of club or organization: Not on file    Attends meetings of clubs  or organizations: Not on file    Relationship status: Not on file  Other Topics Concern  . Not on file  Social History Narrative  . Not on file    Family History  Problem Relation Age of Onset  . Heart disease Sister   . Heart failure Mother        died in early 45s  . Stroke Father 67    Health Maintenance  Topic Date Due  . Janet Berlin  08/27/1956  . DEXA SCAN  08/28/2002  . PNA vac Low Risk Adult (1 of 2 - PCV13) 08/28/2002  . INFLUENZA VACCINE  11/18/2017    ----------------------------------------------------------------------------------------------------------------------------------------------------------------------------------------------------------------- Physical Exam BP 140/70   Pulse 96   Temp 98.7 F (37.1 C) (Oral)   Ht 5' 1.5" (1.562  m)   Wt 131 lb 3.2 oz (59.5 kg)   SpO2 96%   BMI 24.39 kg/m   Physical Exam  Constitutional: She is oriented to person, place, and time. She appears well-nourished. No distress.  HENT:  Mouth/Throat: Oropharynx is clear and moist.  Eyes: No scleral icterus.  Cardiovascular: Normal rate, regular rhythm and normal heart sounds.  Pulmonary/Chest: Effort normal and breath sounds normal.  Abdominal: Soft. Bowel sounds are normal. She exhibits no distension. There is no tenderness.  No CVA tenderness  Neurological: She is alert and oriented to person, place, and time.  Skin: Skin is warm and dry.  Psychiatric: She has a normal mood and affect. Her behavior is normal.    ------------------------------------------------------------------------------------------------------------------------------------------------------------------------------------------------------------------- Assessment and Plan  Adjustment reaction with anxiety -Related to recent loss of husband -has good support from family and has found comfort in adopting a cat.  -Not interested in counseling currently but will let me know if anything changes.   Cystitis -Improved symptoms since starting cephalexin -Will have her continue antibiotic -Remain well hydrated -Culture is still pending, will update plan as needed once this returns.

## 2018-01-20 NOTE — Assessment & Plan Note (Signed)
-  Improved symptoms since starting cephalexin -Will have her continue antibiotic -Remain well hydrated -Culture is still pending, will update plan as needed once this returns.

## 2018-01-21 ENCOUNTER — Emergency Department (HOSPITAL_COMMUNITY)
Admission: EM | Admit: 2018-01-21 | Discharge: 2018-01-21 | Disposition: A | Discharge status: Home / Self Care | Payer: Medicare HMO | Attending: Emergency Medicine | Admitting: Emergency Medicine

## 2018-01-21 ENCOUNTER — Ambulatory Visit (HOSPITAL_COMMUNITY): Payer: Medicare HMO

## 2018-01-21 ENCOUNTER — Other Ambulatory Visit (HOSPITAL_COMMUNITY): Payer: Self-pay

## 2018-01-21 ENCOUNTER — Encounter (HOSPITAL_COMMUNITY): Payer: Self-pay | Admitting: Emergency Medicine

## 2018-01-21 ENCOUNTER — Emergency Department (HOSPITAL_COMMUNITY): Payer: Medicare HMO

## 2018-01-21 ENCOUNTER — Other Ambulatory Visit: Payer: Self-pay

## 2018-01-21 ENCOUNTER — Ambulatory Visit: Payer: Medicare HMO | Admitting: Family Medicine

## 2018-01-21 DIAGNOSIS — I10 Essential (primary) hypertension: Secondary | ICD-10-CM | POA: Diagnosis not present

## 2018-01-21 DIAGNOSIS — Z79899 Other long term (current) drug therapy: Secondary | ICD-10-CM | POA: Diagnosis not present

## 2018-01-21 DIAGNOSIS — R42 Dizziness and giddiness: Secondary | ICD-10-CM | POA: Diagnosis not present

## 2018-01-21 DIAGNOSIS — R9431 Abnormal electrocardiogram [ECG] [EKG]: Secondary | ICD-10-CM | POA: Diagnosis not present

## 2018-01-21 DIAGNOSIS — R55 Syncope and collapse: Secondary | ICD-10-CM | POA: Diagnosis not present

## 2018-01-21 DIAGNOSIS — H81399 Other peripheral vertigo, unspecified ear: Secondary | ICD-10-CM | POA: Diagnosis not present

## 2018-01-21 DIAGNOSIS — R531 Weakness: Secondary | ICD-10-CM | POA: Diagnosis present

## 2018-01-21 LAB — CBG MONITORING, ED: GLUCOSE-CAPILLARY: 184 mg/dL — AB (ref 70–99)

## 2018-01-21 LAB — URINALYSIS, ROUTINE W REFLEX MICROSCOPIC
Bilirubin Urine: NEGATIVE
GLUCOSE, UA: NEGATIVE mg/dL
HGB URINE DIPSTICK: NEGATIVE
KETONES UR: NEGATIVE mg/dL
Leukocytes, UA: NEGATIVE
Nitrite: NEGATIVE
PROTEIN: NEGATIVE mg/dL
Specific Gravity, Urine: 1.015 (ref 1.005–1.030)
pH: 7 (ref 5.0–8.0)

## 2018-01-21 LAB — BASIC METABOLIC PANEL
Anion gap: 11 (ref 5–15)
BUN: 17 mg/dL (ref 8–23)
CALCIUM: 9.5 mg/dL (ref 8.9–10.3)
CHLORIDE: 109 mmol/L (ref 98–111)
CO2: 25 mmol/L (ref 22–32)
CREATININE: 0.74 mg/dL (ref 0.44–1.00)
GFR calc non Af Amer: >60 mL/min (ref >60)
Glucose, Bld: 155 mg/dL — ABNORMAL HIGH (ref 70–99)
Potassium: 3.9 mmol/L (ref 3.5–5.1)
Sodium: 145 mmol/L (ref 135–145)

## 2018-01-21 LAB — CBC
HCT: 43.5 % (ref 36.0–46.0)
Hemoglobin: 14.2 g/dL (ref 12.0–15.0)
MCH: 30 pg (ref 26.0–34.0)
MCHC: 32.6 g/dL (ref 30.0–36.0)
MCV: 91.8 fL (ref 78.0–100.0)
PLATELETS: 182 10*3/uL (ref 150–400)
RBC: 4.74 MIL/uL (ref 3.87–5.11)
RDW: 13.9 % (ref 11.5–15.5)
WBC: 9.4 10*3/uL (ref 4.0–10.5)

## 2018-01-21 MED ORDER — MECLIZINE HCL 25 MG PO TABS: 25.0000 mg | ORAL_TABLET | Freq: Three times a day (TID) | ORAL | 0 refills | Status: DC | PRN

## 2018-01-21 MED ORDER — LACTATED RINGERS IV BOLUS
1000.0000 mL | Freq: Once | INTRAVENOUS | Status: AC
  Administered 2018-01-21: 1000 mL via INTRAVENOUS

## 2018-01-21 NOTE — ED Triage Notes (Signed)
Pt complaint of weakness "like I am going to pass out;" unable to go to restroom this morning; recently diagnosed with UTI and taking antibiotics for such.

## 2018-01-21 NOTE — Discharge Instructions (Addendum)
The neurologist and we suspect that you likely have vertigo. Please perform the epley exercise (printed) to help with equilibration of the inner ear fluid.  Take the medications prescribed for symptom control. See the neurologist as an outpatient in 10 days. Return to the ER if your symptoms get worse.  Please be very careful with any kind of ambulation in your house and take precautions to prevent falls.

## 2018-01-21 NOTE — ED Notes (Signed)
PT felt off balance while standing during vitals  

## 2018-01-21 NOTE — ED Notes (Signed)
ED Provider at bedside. 

## 2018-01-21 NOTE — Progress Notes (Signed)
   [facility]  Impression: Patient with imbalance and subjective leg weakness. Exam nonfocal, MRI brain unremarkable.  Concern for peripheral etiology. Will need EMG/NCS as outpatient to assess for increasing weakness. MRI LSP to assess for lumbar stenosis/radiculopathy  Recommendations:  PT evaluation for gait stability MRI LSP   ---------------------------------------------------------------------  CC:  History of Present Illness:  Patient who presents with leg weakness and unsteadiness for approximately 4 days. She initially reported to the ED 3 days ago and was diagnosed with UTI. She was somewhat improved but today woke up with leg weakness. Denies back pain, bowel retention over 1 day. This is very atypical for her. Denies saddle anesthesia. No weakness in her arms. She denies dizziness, , no headaches, speech or visual changes. She just feels "unsteady" . No N/V, no tinnitus. She denies significant pain, no numbness but does have weakness as if about to fall. No recent travel outside country, denies tick bites.  She has been feeling very stressed since her husband passed away.  Has not fallen.   Diagnostic Testing: MRI brain: unremarkable   Vital Signs:    Exam:  Mental Status:  Awake, alert, oriented  Naming: Intact Repetition: Intact   Speech: fluent  Cranial Nerves:  Pupils: Equal round and reactive to light Extraocular movements: Intact in all cardinal gaze Ptosis: Absent Visual fields: Intact to finger counting Facial sensation: Intact to pin and light touch Facial movements: Intact and symmetric    Motor Exam:  No drift   Tremor/Abnormal Movements:  Resting tremor: Absent Intention tremor: Absent Postural tremor: Absent  Sensory Exam:   Light touch: Intact Pinprick: Intact    Coordination:   Finger to nose: Intact Heel to shin: Intact  Medical Decision Making:  - Extensive number of diagnosis or management  options are considered above.   - Extensive amount of complex data reviewed.   - High risk of complication and/or morbidity or mortality are associated with differential diagnostic considerations above.  - There may be uncertain outcome and increased probability of prolonged functional impairment or high probability of severe prolonged functional impairment associated with some of these differential diagnosis.   Medical Data Reviewed:  1.Data reviewed include clinical labs, radiology,  Medical Tests;   2.Tests results discussed w/performing or interpreting physician;   3.Obtaining/reviewing old medical records;  4.Obtaining case history from another source;  5.Independent review of image, tracing or specimen.    Patient was informed the Neurology Consult would happen via telehealth (remote video) and consented to receiving care in this manner.

## 2018-01-21 NOTE — ED Provider Notes (Signed)
[facility]-EMERGENCY DEPT Provider Note   CSN: [id number] Arrival date & time: 01/21/18  0844     History   Chief Complaint Chief Complaint  Patient presents with  . Weakness    HPI Kara Solomon is a 82 y.o. female.  HPI   82 year old female comes in with chief complaint of weakness.  Patient has history of hypertension.  She reports that 3 days ago she was seen in the ER because of dizziness.  There was some concern for small stroke, however her CT scan was normal and her urine analysis shows infection, therefore she was started on UTI meds.  Although she was feeling well yesterday, she woke up this morning feeling dizzy.  She her dizziness is described as " feeling drunk" and her symptoms are constant.  She denies any near syncope, chest pain, palpitations -however, there is associated nausea.  Of note, patient also had an episode of sweating and neck pain earlier today.   Past Medical History:  Diagnosis Date  . Borderline hyperglycemia   . Cataract   . HTN (hypertension)     Patient Active Problem List   Diagnosis Date Noted  . Cystitis 01/20/2018  . Adjustment reaction with anxiety 12/08/2017  . Memory impairment 10/18/2017  . Essential hypertension 10/18/2017    Past Surgical History:  Procedure Laterality Date  . TONSILLECTOMY       OB History   None      Home Medications    Prior to Admission medications   Medication Sig Start Date End Date Taking? Authorizing Provider  amLODipine (NORVASC) 5 MG tablet Take 1 tablet (5 mg total) by mouth daily. 11/01/17  Yes Everrett Coombe, DO  cephALEXin (KEFLEX) 500 MG capsule Take 1 capsule (500 mg total) by mouth 3 (three) times daily. 01/19/18  Yes Horton, Mayer Masker, MD  Cyanocobalamin (VITAMIN B-12 IJ) Inject 1 each as directed every 30 (thirty) days.   Yes [provider]  Melatonin 5 MG TABS Take 5 mg by mouth at bedtime.   Yes [provider]  Multiple Vitamin  (MULTIVITAMIN WITH MINERALS) TABS tablet Take 1 tablet by mouth daily.   Yes [provider]    Family History Family History  Problem Relation Age of Onset  . Heart disease Sister   . Heart failure Mother        died in early 72s  . Stroke Father 55    Social History Social History   Tobacco Use  . Smoking status: Never Smoker  . Smokeless tobacco: Never Used  Substance Use Topics  . Alcohol use: No    Alcohol/week: 0.0 standard drinks  . Drug use: No     Allergies   Penicillins   Review of Systems Review of Systems  Constitutional: Positive for activity change.  Respiratory: Negative for shortness of breath.   Cardiovascular: Negative for chest pain.  Gastrointestinal: Positive for nausea.  Genitourinary: Negative for dysuria.  Neurological: Positive for dizziness and light-headedness.  All other systems reviewed and are negative.    Physical Exam Updated Vital Signs BP (!) 153/71 (BP Location: Right Arm)   Pulse 73   Temp 98.1 F (36.7 C) (Oral)   Resp 16   SpO2 98%   Physical Exam  Constitutional: She is oriented to person, place, and time. She appears well-developed.  HENT:  Head: Normocephalic and atraumatic.  Eyes: EOM are normal.  Neck: Normal range of motion. Neck supple.  Cardiovascular: Normal rate.  Pulmonary/Chest: Effort  normal.  Abdominal: Bowel sounds are normal.  Neurological: She is alert and oriented to person, place, and time. No cranial nerve deficit. Coordination normal.  Cerebellar exam is normal (finger to nose) Sensory exam normal for bilateral upper and lower extremities - and patient is able to discriminate between sharp and dull. Motor exam is 4+/5 Ataxic gait  Skin: Skin is warm and dry.  Nursing note and vitals reviewed.    ED Treatments / Results  Labs (all labs ordered are listed, but only abnormal results are displayed) Labs Reviewed  BASIC METABOLIC PANEL - Abnormal; Notable for the following  components:      Result Value   Glucose, Bld 155 (*)    All other components within normal limits  URINALYSIS, ROUTINE W REFLEX MICROSCOPIC - Abnormal; Notable for the following components:   APPearance CLOUDY (*)    All other components within normal limits  CBG MONITORING, ED - Abnormal; Notable for the following components:   Glucose-Capillary 184 (*)    All other components within normal limits  CBC    EKG EKG Interpretation  Date/Time:  Friday January 21 2018 08:55:01 EDT Ventricular Rate:  73 PR Interval:    QRS Duration: 86 QT Interval:  410 QTC Calculation: 452 R Axis:   -20 Text Interpretation:  Sinus rhythm Borderline left axis deviation Low voltage, precordial leads Consider anterior infarct No acute changes Confirmed by Derwood Kaplan [phone]) on 01/21/2018 9:40:49 AM Also confirmed by Derwood Kaplan [phone]), editor Sheppard Evens ([id number])  on 01/21/2018 11:06:23 AM   Radiology Mr Brain Wo Contrast  Result Date: 01/21/2018 CLINICAL DATA:  Syncopal episode. Assess for possible stroke. Evaluated 3 days ago for dizziness. EXAM: MRI HEAD WITHOUT CONTRAST TECHNIQUE: Multiplanar, multiecho pulse sequences of the brain and surrounding structures were obtained without intravenous contrast. COMPARISON:  CT 01/18/2018 FINDINGS: Brain: Diffusion imaging does not show any acute or subacute infarction. The brainstem and cerebellum are normal. Cerebral hemispheres are normal for age. No evidence of accelerated atrophy. No old or acute large vessel insult. No mass lesion, hemorrhage, hydrocephalus or extra-axial collection. Vascular: Major vessels at the base of the brain show flow. Skull and upper cervical spine: Negative Sinuses/Orbits: Clear/normal Other: None IMPRESSION: Normal examination for age. No cause of the presenting symptoms is identified. Electronically Signed   By: Paulina Fusi M.D.   On: 01/21/2018 11:31    Procedures Procedures (including critical care  time)  Medications Ordered in ED Medications  lactated ringers bolus 1,000 mL (1,000 mLs Intravenous New Bag/Given 01/21/18 1022)     Initial Impression / Assessment and Plan / ED Course  I have reviewed the triage vital signs and the nursing notes.  Pertinent labs & imaging results that were available during my care of the patient were reviewed by me and considered in my medical decision making (see chart for details).  Clinical Course as of Jan 21 1314  Fri Jan 21, 2018  1315 MRI is negative for acute stroke. We will consult telemetry neurology for persistent ataxia.  MR BRAIN WO CONTRAST [AN]  1315 Results from the ER workup discussed with the patient face to face and all questions answered to the best of my ability.    [AN]    Clinical Course User Index [AN] Derwood Kaplan, MD    DDx includes:  Tumor  Stroke  ICH  Vertebrobasilar TIA  Electrolyte abnormalities.    Final Clinical Impressions(s) / ED Diagnoses   Final diagnoses:  Dizziness  ED Discharge Orders    None       Derwood Kaplan, MD 01/21/18 1315

## 2018-01-26 ENCOUNTER — Ambulatory Visit: Payer: Medicare HMO | Admitting: Neurology

## 2018-01-26 ENCOUNTER — Encounter: Payer: Self-pay | Admitting: Neurology

## 2018-01-26 VITALS — BP 134/72 | HR 88 | Ht 61.5 in | Wt 131.0 lb

## 2018-01-26 DIAGNOSIS — R42 Dizziness and giddiness: Secondary | ICD-10-CM | POA: Diagnosis not present

## 2018-01-26 NOTE — Progress Notes (Signed)
Agree with B12 injection.  

## 2018-01-26 NOTE — Patient Instructions (Signed)
Options:  1. Can wait and see if she feels better with the completion of the antibiotics.  2. If labyrinthitis would treat with steroids but want to hold off right now, would not add steroids at this time 3. BPPV: Vestibular therapy  4. Recommend formal therapy for recent death of husband  5. If symptoms persist despite treatment, repeat MRI brain w/wo contrast, CTA of the head and neck  6. B12 deficiency: getting injections, continue with injections or oral supplementation long term 7. ENT for evaluation for any inner ear issues.  8. Take Meclizine twice a day   Benign Positional Vertigo Vertigo is the feeling that you or your surroundings are moving when they are not. Benign positional vertigo is the most common form of vertigo. The cause of this condition is not serious (is benign). This condition is triggered by certain movements and positions (is positional). This condition can be dangerous if it occurs while you are doing something that could endanger you or others, such as driving. What are the causes? In many cases, the cause of this condition is not known. It may be caused by a disturbance in an area of the inner ear that helps your brain to sense movement and balance. This disturbance can be caused by a viral infection (labyrinthitis), head injury, or repetitive motion. What increases the risk? This condition is more likely to develop in:  Women.  People who are 80 years of age or older.  What are the signs or symptoms? Symptoms of this condition usually happen when you move your head or your eyes in different directions. Symptoms may start suddenly, and they usually last for less than a minute. Symptoms may include:  Loss of balance and falling.  Feeling like you are spinning or moving.  Feeling like your surroundings are spinning or moving.  Nausea and vomiting.  Blurred vision.  Dizziness.  Involuntary eye movement (nystagmus).  Symptoms can be mild and cause only  slight annoyance, or they can be severe and interfere with daily life. Episodes of benign positional vertigo may return (recur) over time, and they may be triggered by certain movements. Symptoms may improve over time. How is this diagnosed? This condition is usually diagnosed by medical history and a physical exam of the head, neck, and ears. You may be referred to a health care provider who specializes in ear, nose, and throat (ENT) problems (otolaryngologist) or a provider who specializes in disorders of the nervous system (neurologist). You may have additional testing, including:  MRI.  A CT scan.  Eye movement tests. Your health care provider may ask you to change positions quickly while he or she watches you for symptoms of benign positional vertigo, such as nystagmus. Eye movement may be tested with an electronystagmogram (ENG), caloric stimulation, the Dix-Hallpike test, or the roll test.  An electroencephalogram (EEG). This records electrical activity in your brain.  Hearing tests.  How is this treated? Usually, your health care provider will treat this by moving your head in specific positions to adjust your inner ear back to normal. Surgery may be needed in severe cases, but this is rare. In some cases, benign positional vertigo may resolve on its own in 2-4 weeks. Follow these instructions at home: Safety  Move slowly.Avoid sudden body or head movements.  Avoid driving.  Avoid operating heavy machinery.  Avoid doing any tasks that would be dangerous to you or others if a vertigo episode would occur.  If you have trouble walking or  keeping your balance, try using a cane for stability. If you feel dizzy or unstable, sit down right away.  Return to your normal activities as told by your health care provider. Ask your health care provider what activities are safe for you. General instructions  Take over-the-counter and prescription medicines only as told by your health care  provider.  Avoid certain positions or movements as told by your health care provider.  Drink enough fluid to keep your urine clear or pale yellow.  Keep all follow-up visits as told by your health care provider. This is important. Contact a health care provider if:  You have a fever.  Your condition gets worse or you develop new symptoms.  Your family or friends notice any behavioral changes.  Your nausea or vomiting gets worse.  You have numbness or a "pins and needles" sensation. Get help right away if:  You have difficulty speaking or moving.  You are always dizzy.  You faint.  You develop severe headaches.  You have weakness in your legs or arms.  You have changes in your hearing or vision.  You develop a stiff neck.  You develop sensitivity to light. This information is not intended to replace advice given to you by your health care provider. Make sure you discuss any questions you have with your health care provider. Document Released: 01/12/2006 Document Revised: 09/12/2015 Document Reviewed: 07/30/2014 Elsevier Interactive Patient Education  2018 [employer].   Labyrinthitis Labyrinthitis is an infection of the inner ear. Your inner ear is a system of tubes and canals (labyrinth). These are filled with fluid. Nerve cells in your inner ear send signals for hearing and balance to your brain. When tiny germs get inside the tubes and canals, they harm the cells that send messages to the brain. This can cause changes in hearing and balance. Follow these instructions at home:  Take medicines only as told by your doctor.  If you were prescribed an antibiotic medicine, finish all of it even if you start to feel better.  Rest as much as possible.  Avoid loud noises and bright lights.  Do not make sudden movements until any dizziness goes away.  Do not drive until your doctor says that you can.  Drink enough fluid to keep your pee (urine) clear or pale  yellow.  Work with a physical therapist if you still feel dizzy after several weeks. A therapist can teach you exercises to help you deal with your dizziness.  Keep all follow-up visits as told by your doctor. This is important. Contact a doctor if:  Your symptoms do not get better with medicines.  You do not get better after two weeks.  You have a fever. Get help right away if:  You are very dizzy.  You keep throwing up (vomiting) or keep feeling sick to your stomach (nauseous).  Your hearing gets a lot worse very quickly. This information is not intended to replace advice given to you by your health care provider. Make sure you discuss any questions you have with your health care provider. Document Released: 04/06/2005 Document Revised: 09/12/2015 Document Reviewed: 01/16/2014 Elsevier Interactive Patient Education  2018 [employer].   Dizziness Dizziness is a common problem. It is a feeling of unsteadiness or light-headedness. You may feel like you are about to faint. Dizziness can lead to injury if you stumble or fall. Anyone can become dizzy, but dizziness is more common in older adults. This condition can be caused by a number  of things, including medicines, dehydration, or illness. Follow these instructions at home: Eating and drinking  Drink enough fluid to keep your urine clear or pale yellow. This helps to keep you from becoming dehydrated. Try to drink more clear fluids, such as water.  Do not drink alcohol.  Limit your caffeine intake if told to do so by your health care provider. Check ingredients and nutrition facts to see if a food or beverage contains caffeine.  Limit your salt (sodium) intake if told to do so by your health care provider. Check ingredients and nutrition facts to see if a food or beverage contains sodium. Activity  Avoid making quick movements. ? Rise slowly from chairs and steady yourself until you feel okay. ? In the morning, first sit up  on the side of the bed. When you feel okay, stand slowly while you hold onto something until you know that your balance is fine.  If you need to stand in one place for a long time, move your legs often. Tighten and relax the muscles in your legs while you are standing.  Do not drive or use heavy machinery if you feel dizzy.  Avoid bending down if you feel dizzy. Place items in your home so that they are easy for you to reach without leaning over. Lifestyle  Do not use any products that contain nicotine or tobacco, such as cigarettes and e-cigarettes. If you need help quitting, ask your health care provider.  Try to reduce your stress level by using methods such as yoga or meditation. Talk with your health care provider if you need help to manage your stress. General instructions  Watch your dizziness for any changes.  Take over-the-counter and prescription medicines only as told by your health care provider. Talk with your health care provider if you think that your dizziness is caused by a medicine that you are taking.  Tell a friend or a family member that you are feeling dizzy. If he or she notices any changes in your behavior, have this person call your health care provider.  Keep all follow-up visits as told by your health care provider. This is important. Contact a health care provider if:  Your dizziness does not go away.  Your dizziness or light-headedness gets worse.  You feel nauseous.  You have reduced hearing.  You have new symptoms.  You are unsteady on your feet or you feel like the room is spinning. Get help right away if:  You vomit or have diarrhea and are unable to eat or drink anything.  You have problems talking, walking, swallowing, or using your arms, hands, or legs.  You feel generally weak.  You are not thinking clearly or you have trouble forming sentences. It may take a friend or family member to notice this.  You have chest pain, abdominal pain,  shortness of breath, or sweating.  Your vision changes.  You have any bleeding.  You have a severe headache.  You have neck pain or a stiff neck.  You have a fever. These symptoms may represent a serious problem that is an emergency. Do not wait to see if the symptoms will go away. Get medical help right away. Call your local emergency services (911 in the [place] Do not drive yourself to the hospital. Summary  Dizziness is a feeling of unsteadiness or light-headedness. This condition can be caused by a number of things, including medicines, dehydration, or illness.  Anyone can become dizzy, but dizziness is more  common in older adults.  Drink enough fluid to keep your urine clear or pale yellow. Do not drink alcohol.  Avoid making quick movements if you feel dizzy. Monitor your dizziness for any changes. This information is not intended to replace advice given to you by your health care provider. Make sure you discuss any questions you have with your health care provider. Document Released: 09/30/2000 Document Revised: 05/09/2016 Document Reviewed: 05/09/2016 Elsevier Interactive Patient Education  [employer].

## 2018-01-26 NOTE — Progress Notes (Signed)
[facility]    Provider:  Dr Lucia Gaskins Referring Provider: Everrett Coombe, DO Primary Care Physician:  Everrett Coombe, DO  CC:  dizziness  HPI:  Kara Solomon is a 82 y.o. female here as requested by Dr. Ashley Royalty for vertigo. Her husband passed away in December 20, 2022 of this year and a week afterwards she started feeling an "uneasiness". She felt when she was walking she has dizziness. She has it every day, some days are worse. No frank vertigo but when moving her head she gets "woozy" Worse when turning her head when having an episode. Sunday she had a "flare up",with eyes closed and siting still it is better. She becomes anxious when it happens. No nausea. She "feels drunk". She denies nausea today (but the ED notes state nausea). Meclizine helped but it took an hour.  She is feeling better this week after being treated with the antibiotics, yesterday was good. No headaches. Daughter provides much information, says patient's blood pressure is not low. She hydrates well. She has ringing in the ears but no fullness or hearing loss.   Reviewed notes, labs and imaging from outside physicians, which showed:  MRI of the brain unremarkable. Normal for age.   Review of Systems: Patient complains of symptoms per HPI as well as the following symptoms: dizziness. Pertinent negatives and positives per HPI. All others negative.   Social History   Socioeconomic History  . Marital status: Widowed    Spouse name: Not on file  . Number of children: 3  . Years of education: Not on file  . Highest education level: High school graduate  Occupational History  . Not on file  Social Needs  . Financial resource strain: Not on file  . Food insecurity:    Worry: Not on file    Inability: Not on file  . Transportation needs:    Medical: Not on file    Non-medical: Not on file  Tobacco Use  . Smoking status: Never Smoker  . Smokeless tobacco: Never Used  Substance and Sexual Activity  .  Alcohol use: No    Alcohol/week: 0.0 standard drinks  . Drug use: No  . Sexual activity: Not on file  Lifestyle  . Physical activity:    Days per week: Not on file    Minutes per session: Not on file  . Stress: Not on file  Relationships  . Social connections:    Talks on phone: Not on file    Gets together: Not on file    Attends religious service: Not on file    Active member of club or organization: Not on file    Attends meetings of clubs or organizations: Not on file    Relationship status: Not on file  . Intimate partner violence:    Fear of current or ex partner: Not on file    Emotionally abused: Not on file    Physically abused: Not on file    Forced sexual activity: Not on file  Other Topics Concern  . Not on file  Social History Narrative   Lives at home alone   Right handed   Caffeine: none    Family History  Problem Relation Age of Onset  . Heart disease Sister   . Heart failure Mother        died in early 43s  . Stroke Father 67  . Breast cancer Daughter     Past Medical History:  Diagnosis Date  . Borderline hyperglycemia   .  Cataract   . HTN (hypertension)     Past Surgical History:  Procedure Laterality Date  . TONSILLECTOMY      Current Outpatient Medications  Medication Sig Dispense Refill  . amLODipine (NORVASC) 5 MG tablet Take 1 tablet (5 mg total) by mouth daily. 90 tablet 1  . cephALEXin (KEFLEX) 500 MG capsule Take 1 capsule (500 mg total) by mouth 3 (three) times daily. 21 capsule 0  . Cyanocobalamin (VITAMIN B-12 IJ) Inject 1 each as directed every 30 (thirty) days.    . meclizine (ANTIVERT) 25 MG tablet Take 1 tablet (25 mg total) by mouth 3 (three) times daily as needed for dizziness. 40 tablet 0  . Multiple Vitamin (MULTIVITAMIN WITH MINERALS) TABS tablet Take 1 tablet by mouth daily.    . Melatonin 5 MG TABS Take 5 mg by mouth at bedtime as needed.      Current Facility-Administered Medications  Medication Dose Route  Frequency Provider Last Rate Last Dose  . cyanocobalamin ((VITAMIN B-12)) injection 1,000 mcg  1,000 mcg Intramuscular Q30 days Everrett Coombe, DO   1,000 mcg at 01/11/18 0820    Allergies as of 01/26/2018 - Review Complete 01/26/2018  Allergen Reaction Noted  . Penicillins Hives 11/21/2014    Vitals: BP 134/72 (BP Location: Right Arm, Patient Position: Sitting)   Pulse 88   Ht 5' 1.5" (1.562 m)   Wt 131 lb (59.4 kg)   BMI 24.35 kg/m  Last Weight:  Wt Readings from Last 1 Encounters:  01/26/18 131 lb (59.4 kg)   Last Height:   Ht Readings from Last 1 Encounters:  01/26/18 5' 1.5" (1.562 m)  Physical exam: Exam: Gen: Anxious              CV: RRR, no MRG. No Carotid Bruits. No peripheral edema, warm, nontender Eyes: Conjunctivae clear without exudates or hemorrhage  Neuro: Detailed Neurologic Exam  Speech:    Speech is normal; fluent and spontaneous with normal comprehension.  Cognition:    The patient is oriented to person, place, and time;     recent and remote memory impared;     language fluent;     Impaired attention, concentration, fund of knowledge Cranial Nerves:    The pupils are surgical. Attempted fundoscopic exam could not visualize.  Visual fields are full to finger confrontation. Extraocular movements are intact. Trigeminal sensation is intact and the muscles of mastication are normal. The face is symmetric. The palate elevates in the midline. Hearing intact. Voice is normal. Shoulder shrug is normal. The tongue has normal motion without fasciculations.   Coordination:    No dysmetria.   Gait:    Heel-toe and tandem gait are normal. Good balance.   Motor Observation:    No asymmetry, no atrophy, and no involuntary movements noted. Tone:    Normal muscle tone.    Posture:    Posture is normal. normal erect    Strength:    Strength is V/V in the upper and lower limbs.      Sensation: intact to LT     Reflex Exam:  DTR's:    Absent AJs,  otherwise deep tendon reflexes in the upper and lower extremities are equiv bilaterally.   Toes:    The toes are equiv bilaterally.   Clonus:    Clonus is absent.        Assessment/Plan:  82 year old with dizziness in the setting of her husband's death and a UTI. Differential is BPPV, Labyrinthitis, UTI (  currently being treated.)  Options:  1. Can wait and see if she feels better with the completion of the antibiotics.  2. If labyrinthitis would treat with steroids but want to hold off right now, would not add steroids at this time . 3. BPPV: Vestibular therapy  4. Recommend formal therapy for recent death of husband  5. If symptoms persist despite treatment, repeat MRI brain w/wo contrast, CTA of the head and neck - recommended now, she would like to hold off for now 6. B12 deficiency: getting injections, continue with injections or oral supplementation long term 7. ENT for evaluation for any inner ear issues.  8. Take Meclizine twice a day    Orders Placed This Encounter  Procedures  . Ambulatory referral to Physical Therapy  . Ambulatory referral to ENT     Naomie Dean, MD  [facility] [address] [place], [place] [address]  Phone [phone] Fax [phone]

## 2018-02-10 ENCOUNTER — Ambulatory Visit (INDEPENDENT_AMBULATORY_CARE_PROVIDER_SITE_OTHER): Payer: Medicare HMO

## 2018-02-10 DIAGNOSIS — Z23 Encounter for immunization: Secondary | ICD-10-CM | POA: Diagnosis not present

## 2018-02-10 DIAGNOSIS — E538 Deficiency of other specified B group vitamins: Secondary | ICD-10-CM

## 2018-02-10 MED ORDER — CYANOCOBALAMIN 1000 MCG/ML IJ SOLN
1000.0000 ug | Freq: Once | INTRAMUSCULAR | Status: AC
  Administered 2018-02-10: 1000 ug via INTRAMUSCULAR

## 2018-02-10 NOTE — Progress Notes (Signed)
Patient came in to get B12 shot today and pt also request a flu shot. Verbal okey from Dr. Ashley Royalty. Pt tolerant injections well. Next does for B12 in 1 mo.

## 2018-02-10 NOTE — Progress Notes (Signed)
Agree with B12 injection per standing order.

## 2018-03-07 DIAGNOSIS — Z961 Presence of intraocular lens: Secondary | ICD-10-CM | POA: Diagnosis not present

## 2018-03-07 DIAGNOSIS — H26493 Other secondary cataract, bilateral: Secondary | ICD-10-CM | POA: Diagnosis not present

## 2018-03-07 DIAGNOSIS — H43813 Vitreous degeneration, bilateral: Secondary | ICD-10-CM | POA: Diagnosis not present

## 2018-03-07 DIAGNOSIS — R42 Dizziness and giddiness: Secondary | ICD-10-CM | POA: Diagnosis not present

## 2018-03-10 ENCOUNTER — Encounter: Payer: Self-pay | Admitting: Family Medicine

## 2018-03-10 ENCOUNTER — Ambulatory Visit (INDEPENDENT_AMBULATORY_CARE_PROVIDER_SITE_OTHER): Payer: Medicare HMO | Admitting: Family Medicine

## 2018-03-10 VITALS — BP 120/62 | HR 75 | Temp 98.2°F | Ht 62.0 in | Wt 129.0 lb

## 2018-03-10 DIAGNOSIS — I10 Essential (primary) hypertension: Secondary | ICD-10-CM

## 2018-03-10 DIAGNOSIS — E538 Deficiency of other specified B group vitamins: Secondary | ICD-10-CM

## 2018-03-10 DIAGNOSIS — F0390 Unspecified dementia without behavioral disturbance: Secondary | ICD-10-CM | POA: Insufficient documentation

## 2018-03-10 DIAGNOSIS — R69 Illness, unspecified: Secondary | ICD-10-CM | POA: Diagnosis not present

## 2018-03-10 LAB — VITAMIN B12: VITAMIN B 12: 297 pg/mL (ref 211–911)

## 2018-03-10 NOTE — Assessment & Plan Note (Signed)
-  Daughter reports worsening cognitive decline, referral placed to neurology -Update b12 levels

## 2018-03-10 NOTE — Assessment & Plan Note (Signed)
-  BP is well controlled, continue amlodipine

## 2018-03-10 NOTE — Progress Notes (Signed)
Kara Solomon - 82 y.o. female MRN [id number]  Date of birth: 1935-10-18  Subjective Chief Complaint  Patient presents with  . Hypertension    HPI Kara Solomon is a 82 y.o. female with history of HTN, cognitive impairment and vertigo here today for follow up visit.   -HTN:  Current treatment with amlodipine 5mg , doing well with this.  She denies side effects including significant edema or symptoms of hypotension.  She was having some dizziness previously which was attributed to UTI and vertigo.   This has mostly resolved.  She denies chest pain, shortness of breath, palpitations, headache or vision changes.   -Cognitive impairment:  Has had some cognitive/memory impairment over the past few months.  B12 levels were found to be a little low and she has been receiving treatment for this.  Her daughter reports that she has not noticed any improvement since starting these.  Her daughter reports she has noticed that she has more trouble processing thoughts and using context to figure things out.  She reports that she actually seems to be declining especially since her husband passed away.  She does feel down since her husbands passing but doesn't feel overly depressed at this time.  She had a recent CT and MRI of the brain that were normal.  She has good support from family.   ROS:  A comprehensive ROS was completed and negative except as noted per HPI  Allergies  Allergen Reactions  . Penicillins Hives    Has patient had a PCN reaction causing immediate rash, facial/tongue/throat swelling, SOB or lightheadedness with hypotension: Unknown Has patient had a PCN reaction causing severe rash involving mucus membranes or skin necrosis: Unknown Has patient had a PCN reaction that required hospitalization: Unknown Has patient had a PCN reaction occurring within the last 10 years: No If all of the above answers are "NO", then may proceed with Cephalosporin use.     Past Medical History:  Diagnosis  Date  . Borderline hyperglycemia   . Cataract   . HTN (hypertension)     Past Surgical History:  Procedure Laterality Date  . TONSILLECTOMY      Social History   Socioeconomic History  . Marital status: Widowed    Spouse name: Not on file  . Number of children: 3  . Years of education: Not on file  . Highest education level: High school graduate  Occupational History  . Not on file  Social Needs  . Financial resource strain: Not on file  . Food insecurity:    Worry: Not on file    Inability: Not on file  . Transportation needs:    Medical: Not on file    Non-medical: Not on file  Tobacco Use  . Smoking status: Never Smoker  . Smokeless tobacco: Never Used  Substance and Sexual Activity  . Alcohol use: No    Alcohol/week: 0.0 standard drinks  . Drug use: No  . Sexual activity: Not on file  Lifestyle  . Physical activity:    Days per week: Not on file    Minutes per session: Not on file  . Stress: Not on file  Relationships  . Social connections:    Talks on phone: Not on file    Gets together: Not on file    Attends religious service: Not on file    Active member of club or organization: Not on file    Attends meetings of clubs or organizations: Not on file    Relationship  status: Not on file  Other Topics Concern  . Not on file  Social History Narrative   Lives at home alone   Right handed   Caffeine: none    Family History  Problem Relation Age of Onset  . Heart disease Sister   . Heart failure Mother        died in early 40s  . Stroke Father 69  . Breast cancer Daughter     Health Maintenance  Topic Date Due  . TETANUS/TDAP  08/27/1956  . DEXA SCAN  08/28/2002  . PNA vac Low Risk Adult (1 of 2 - PCV13) 08/28/2002  . INFLUENZA VACCINE  Completed     ----------------------------------------------------------------------------------------------------------------------------------------------------------------------------------------------------------------- Physical Exam BP 120/62   Pulse 75   Temp 98.2 F (36.8 C)   Ht 5\' 2"  (1.575 m)   Wt 129 lb (58.5 kg)   SpO2 97%   BMI 23.59 kg/m   Physical Exam  Constitutional: She is oriented to person, place, and time. She appears well-nourished. No distress.  HENT:  Head: Normocephalic and atraumatic.  Mouth/Throat: Oropharynx is clear and moist.  Eyes: No scleral icterus.  Neck: Neck supple. No thyromegaly present.  Cardiovascular: Normal rate, regular rhythm and normal heart sounds.  Pulmonary/Chest: Effort normal and breath sounds normal.  Musculoskeletal: She exhibits no tenderness.  Neurological: She is alert and oriented to person, place, and time. No cranial nerve deficit.  Skin: No rash noted.  Psychiatric: She has a normal mood and affect. Her behavior is normal.    ------------------------------------------------------------------------------------------------------------------------------------------------------------------------------------------------------------------- Assessment and Plan  Essential hypertension -BP is well controlled, continue amlodipine   Dementia without behavioral disturbance (HCC) -Daughter reports worsening cognitive decline, referral placed to neurology -Update b12 levels

## 2018-05-03 ENCOUNTER — Ambulatory Visit (INDEPENDENT_AMBULATORY_CARE_PROVIDER_SITE_OTHER): Payer: Medicare HMO

## 2018-05-03 DIAGNOSIS — E538 Deficiency of other specified B group vitamins: Secondary | ICD-10-CM | POA: Diagnosis not present

## 2018-05-03 MED ORDER — AMLODIPINE BESYLATE 5 MG PO TABS: 5.0000 mg | ORAL_TABLET | Freq: Every day | ORAL | 1 refills | Status: DC

## 2018-05-03 MED ORDER — CYANOCOBALAMIN 1000 MCG/ML IJ SOLN
1000.0000 ug | Freq: Once | INTRAMUSCULAR | Status: AC
  Administered 2018-05-03: 1000 ug via INTRAMUSCULAR

## 2018-05-03 NOTE — Progress Notes (Signed)
After obtaining consent, and per orders of Dr. Ashley Royalty, injection of Cyanocobalamin 1021mcg/mL given in Left Deltoid by Laquia Rosano Jule Ser. Patient instructed to remain in clinic for 20 minutes afterwards, and to report any adverse reaction to me immediately.  She will be certain she is scheduled for 1 month to receive next injection/thx dmf

## 2018-05-03 NOTE — Progress Notes (Signed)
Agree with administration for B12 as documented.

## 2018-06-01 ENCOUNTER — Ambulatory Visit: Payer: Medicare HMO | Admitting: Neurology

## 2018-06-01 ENCOUNTER — Encounter: Payer: Self-pay | Admitting: Neurology

## 2018-06-01 VITALS — Ht 61.0 in | Wt 130.0 lb

## 2018-06-01 DIAGNOSIS — R69 Illness, unspecified: Secondary | ICD-10-CM | POA: Diagnosis not present

## 2018-06-01 DIAGNOSIS — F419 Anxiety disorder, unspecified: Secondary | ICD-10-CM | POA: Diagnosis not present

## 2018-06-01 DIAGNOSIS — G309 Alzheimer's disease, unspecified: Secondary | ICD-10-CM | POA: Diagnosis not present

## 2018-06-01 DIAGNOSIS — F0390 Unspecified dementia without behavioral disturbance: Secondary | ICD-10-CM

## 2018-06-01 DIAGNOSIS — F329 Major depressive disorder, single episode, unspecified: Secondary | ICD-10-CM | POA: Diagnosis not present

## 2018-06-01 DIAGNOSIS — F32A Depression, unspecified: Secondary | ICD-10-CM

## 2018-06-01 NOTE — Progress Notes (Addendum)
[facility]    Provider:  Dr Lucia GaskinsAhern Referring Provider: Everrett CoombeMatthews, Cody, DO Primary Care Provider:  Everrett CoombeMatthews, Cody, DO  CC:  Memory changes  HPI:  Kara Solomon is a 83 y.o. female here as requested by provider Everrett CoombeMatthews, Cody, DO for dementia. Here with daughters who provide much information. She is having short-term memory deficits for years. Noticed significantly last [place] a baseball [place] and she didn't know where she was, family was around her. [place] went to [place], she was very scared she would get lost. Has been worse since her husband died in August. She is getting injections for B12. She [facility] and then forgot. She gets confused easily. She can;t follow conversations sometimes. She doesn't remember how to pump gas but patient says every gas station is different and it confused her. There is a restaurant that they have gone to for years and she needs guidance to get there. Vertigo has resolved. Unclear if it is memory or if she has poor attention. She lives alone. Her son in law pays the bills. She does some cleaning. She stopped cooking and they eat out a lot. No accidents in th home. She searches for words. She has depression as well.  Reviewed notes, labs and imaging from outside physicians, which showed:  TSH, RPR normal. B12 224.   MRI brain 01/21/2018 :personally reviewed images and agree with the following: Normal examination for age. No cause of the presenting symptoms is Identified.  Review of Systems: Patient complains of symptoms per HPI as well as the following symptoms: memory loss. Pertinent negatives and positives per HPI. All others negative.   Social History   Socioeconomic History  . Marital status: Widowed    Spouse name: Not on file  . Number of children: 3  . Years of education: Not on file  . Highest education level: High school graduate  Occupational History  . Not on file  Social Needs  . Financial resource  strain: Not on file  . Food insecurity:    Worry: Not on file    Inability: Not on file  . Transportation needs:    Medical: Not on file    Non-medical: Not on file  Tobacco Use  . Smoking status: Never Smoker  . Smokeless tobacco: Never Used  Substance and Sexual Activity  . Alcohol use: No    Alcohol/week: 0.0 standard drinks  . Drug use: No  . Sexual activity: Not on file  Lifestyle  . Physical activity:    Days per week: Not on file    Minutes per session: Not on file  . Stress: Not on file  Relationships  . Social connections:    Talks on phone: Not on file    Gets together: Not on file    Attends religious service: Not on file    Active member of club or organization: Not on file    Attends meetings of clubs or organizations: Not on file    Relationship status: Not on file  . Intimate partner violence:    Fear of current or ex partner: Not on file    Emotionally abused: Not on file    Physically abused: Not on file    Forced sexual activity: Not on file  Other Topics Concern  . Not on file  Social History Narrative   Lives at home alone   Right handed   Caffeine: none    Family History  Problem Relation Age of Onset  .  Heart disease Sister   . Heart failure Mother        died in early 48s  . Stroke Father 4  . Dementia Maternal Grandmother   . Breast cancer Daughter     Past Medical History:  Diagnosis Date  . Borderline hyperglycemia   . Cataract   . HTN (hypertension)     Patient Active Problem List   Diagnosis Date Noted  . Anxiety and depression 06/01/2018  . Dementia without behavioral disturbance (HCC) 03/10/2018  . Adjustment reaction with anxiety 12/08/2017  . Memory impairment 10/18/2017  . Essential hypertension 10/18/2017    Past Surgical History:  Procedure Laterality Date  . CATARACT EXTRACTION, BILATERAL    . TONSILLECTOMY      Current Outpatient Medications  Medication Sig Dispense Refill  . amLODipine (NORVASC) 5 MG  tablet Take 1 tablet (5 mg total) by mouth daily. 90 tablet 1  . Melatonin 5 MG TABS Take 5 mg by mouth at bedtime as needed.     . Multiple Vitamin (MULTIVITAMIN WITH MINERALS) TABS tablet Take 1 tablet by mouth daily.     No current facility-administered medications for this visit.     Allergies as of 06/01/2018 - Review Complete 06/01/2018  Allergen Reaction Noted  . Penicillins Hives 11/21/2014    Vitals: Ht 5\' 1"  (1.549 m)   Wt 130 lb (59 kg)   BMI 24.56 kg/m  Last Weight:  Wt Readings from Last 1 Encounters:  06/01/18 130 lb (59 kg)   Last Height:   Ht Readings from Last 1 Encounters:  06/01/18 5\' 1"  (1.549 m)     Physical exam: Exam: Gen: NAD, appears anxious with hand wringing                  CV: RRR, no MRG. No Carotid Bruits. No peripheral edema, warm, nontender Eyes: Conjunctivae clear without exudates or hemorrhage  Neuro: Detailed Neurologic Exam  Speech:    Speech is normal; fluent and spontaneous with normal comprehension.  Cognition:  MMSE - Mini Mental State Exam 06/01/2018  Orientation to time 3  Orientation to Place 4  Registration 3  Attention/ Calculation 5  Recall 2  Language- name 2 objects 2  Language- repeat 1  Language- follow 3 step command 3  Language- read & follow direction 1  Write a sentence 1  Copy design 1  Total score 26       The patient is oriented to person, place, and time;     recent and remote memory intact;     language fluent;     normal attention, concentration,     fund of knowledge Cranial Nerves:    The pupils are equal, round, and reactive to light.  Visual fields are full to finger confrontation. Extraocular movements are intact. Trigeminal sensation is intact and the muscles of mastication are normal. The face is symmetric. The palate elevates in the midline. Hearing intact. Voice is normal. Shoulder shrug is normal. The tongue has normal motion without fasciculations.   Coordination:    No  dysmetria  Gait:    Normal native gait  Motor Observation:    No asymmetry, no atrophy, and no involuntary movements noted. Tone:    Normal muscle tone.    Posture:    Posture is normal. normal erect    Strength:    Strength is V/V in the upper and lower limbs.      Sensation: intact to LT     Assessment/Plan:  8 83 year old female here with her daughters with progressive memory changes possible MCI of alzheimers pathology but also may be pseudodementia due to depression and anxiety that is not well treated  - needs to treat depression, discussed therapy, provided recommendations - FDG PET Scan for dementia evaluation - Formal neurocognitive testing - Consider Aricept after testing above  Orders Placed This Encounter  Procedures  . NM PET Metabolic Brain  . Ambulatory referral to Neuropsychology   A total of 40 minutes was spent face-to-face with this patient. Over half this time was spent on counseling patient on the  1. Dementia without behavioral disturbance, unspecified dementia type (HCC)   2. Anxiety and depression   3. Alzheimer's disease, unspecified (CODE) (HCC)     diagnosis and different diagnostic and therapeutic options, counseling and coordination of care, risks ans benefits of management, compliance, or risk factor reduction and education.     Cc: Everrett Coombe, DO,    Naomie Dean, MD  [facility] [address] [place], [place] [address]  Phone [phone] Fax [phone]

## 2018-06-01 NOTE — Patient Instructions (Addendum)
Formal memory testing Need therapy for depression and anxiety FDG PET Scan for Dementia - will order Consider Aricept  Donepezil tablets What is this medicine? DONEPEZIL (doe NEP e zil) is used to treat mild to moderate dementia caused by Alzheimer's disease. This medicine may be used for other purposes; ask your health care provider or pharmacist if you have questions. COMMON BRAND NAME(S): Aricept What should I tell my health care provider before I take this medicine? They need to know if you have any of these conditions: -asthma or other lung disease -difficulty passing urine -head injury -heart disease -history of irregular heartbeat -liver disease -seizures (convulsions) -stomach or intestinal disease, ulcers or stomach bleeding -an unusual or allergic reaction to donepezil, other medicines, foods, dyes, or preservatives -pregnant or trying to get pregnant -breast-feeding How should I use this medicine? Take this medicine by mouth with a glass of water. Follow the directions on the prescription label. You may take this medicine with or without food. Take this medicine at regular intervals. This medicine is usually taken before bedtime. Do not take it more often than directed. Continue to take your medicine even if you feel better. Do not stop taking except on your doctor's advice. If you are taking the 23 mg donepezil tablet, swallow it whole; do not cut, crush, or chew it. Talk to your pediatrician regarding the use of this medicine in children. Special care may be needed. Overdosage: If you think you have taken too much of this medicine contact a poison control center or emergency room at once. NOTE: This medicine is only for you. Do not share this medicine with others. What if I miss a dose? If you miss a dose, take it as soon as you can. If it is almost time for your next dose, take only that dose, do not take double or extra doses. What may interact with this medicine? Do  not take this medicine with any of the following medications: -certain medicines for fungal infections like itraconazole, fluconazole, posaconazole, and voriconazole -cisapride -dextromethorphan; quinidine -dofetilide -dronedarone -pimozide -quinidine -thioridazine -ziprasidone This medicine may also interact with the following medications: -antihistamines for allergy, cough and cold -atropine -bethanechol -carbamazepine -certain medicines for bladder problems like oxybutynin, tolterodine -certain medicines for Parkinson's disease like benztropine, trihexyphenidyl -certain medicines for stomach problems like dicyclomine, hyoscyamine -certain medicines for travel sickness like scopolamine -dexamethasone -ipratropium -NSAIDs, medicines for pain and inflammation, like ibuprofen or naproxen -other medicines for Alzheimer's disease -other medicines that prolong the QT interval (cause an abnormal heart rhythm) -phenobarbital -phenytoin -rifampin, rifabutin or rifapentine This list may not describe all possible interactions. Give your health care provider a list of all the medicines, herbs, non-prescription drugs, or dietary supplements you use. Also tell them if you smoke, drink alcohol, or use illegal drugs. Some items may interact with your medicine. What should I watch for while using this medicine? Visit your doctor or health care professional for regular checks on your progress. Check with your doctor or health care professional if your symptoms do not get better or if they get worse. You may get drowsy or dizzy. Do not drive, use machinery, or do anything that needs mental alertness until you know how this drug affects you. What side effects may I notice from receiving this medicine? Side effects that you should report to your doctor or health care professional as soon as possible: -allergic reactions like skin rash, itching or hives, swelling of the face, lips, or tongue -feeling  faint or lightheaded, falls -loss of bladder control -seizures -signs and symptoms of a dangerous change in heartbeat or heart rhythm like chest pain; dizziness; fast or irregular heartbeat; palpitations; feeling faint or lightheaded, falls; breathing problems -signs and symptoms of infection like fever or chills; cough; sore throat; pain or trouble passing urine -signs and symptoms of liver injury like dark yellow or brown urine; general ill feeling or flu-like symptoms; light-colored stools; loss of appetite; nausea; right upper belly pain; unusually weak or tired; yellowing of the eyes or skin -slow heartbeat or palpitations -unusual bleeding or bruising -vomiting Side effects that usually do not require medical attention (report to your doctor or health care professional if they continue or are bothersome): -diarrhea, especially when starting treatment -headache -loss of appetite -muscle cramps -nausea -stomach upset This list may not describe all possible side effects. Call your doctor for medical advice about side effects. You may report side effects to FDA at 1-800-FDA-1088. Where should I keep my medicine? Keep out of reach of children. Store at room temperature between 15 and 30 degrees C (59 and 86 degrees F). Throw away any unused medicine after the expiration date. NOTE: This sheet is a summary. It may not cover all possible information. If you have questions about this medicine, talk to your doctor, pharmacist, or health care provider.  2019 Elsevier/Gold Standard (2015-09-23 21:00:42)   Memory Compensation Strategies  1. Use "WARM" strategy.  W= write it down  A= associate it  R= repeat it  M= make a mental note  2.   You can keep a Glass blower/designer.  Use a 3-ring notebook with sections for the following: calendar, important names and phone numbers,  medications, doctors' names/phone numbers, lists/reminders, and a section to journal what you did  each day.   3.     Use a calendar to write appointments down.  4.    Write yourself a schedule for the day.  This can be placed on the calendar or in a separate section of the Memory Notebook.  Keeping a  regular schedule can help memory.  5.    Use medication organizer with sections for each day or morning/evening pills.  You may need help loading it  6.    Keep a basket, or pegboard by the door.  Place items that you need to take out with you in the basket or on the pegboard.  You may also want to  include a message board for reminders.  7.    Use sticky notes.  Place sticky notes with reminders in a place where the task is performed.  For example: " turn off the  stove" placed by the stove, "lock the door" placed on the door at eye level, " take your medications" on  the bathroom mirror or by the place where you normally take your medications.  8.    Use alarms/timers.  Use while cooking to remind yourself to check on food or as a reminder to take your medicine, or as a  reminder to make a call, or as a reminder to perform another task, etc.

## 2018-06-02 ENCOUNTER — Telehealth: Payer: Self-pay | Admitting: Neurology

## 2018-06-02 NOTE — Addendum Note (Signed)
Addended by: Naomie Dean B on: 06/02/2018 08:58 AM   Modules accepted: Orders

## 2018-06-02 NOTE — Telephone Encounter (Signed)
[employer] pending faxed clinical notes °

## 2018-06-07 NOTE — Telephone Encounter (Signed)
Let them know that it was declined, a lot of insurances will not approve this test until the formal memory testing is completed so we will have to wait for that thanks

## 2018-06-07 NOTE — Telephone Encounter (Signed)
[employer] did not approve the PET scan.  "Based on Medicare national coverage determinations for Dementia and Neurodegenerative Diseases, we cannot approve this request. Your records show that you have a condition that affects you memory. The reason this request cannot be approved is because 1. Guidelines support a positron emission tomography (PET) scan for the evaluation of individuals with documented cognitive decline of at least six months. The clinical information provided does not meet this criterion. 2. Guidelines support a positron emission tomography scan for the evaluation of individuals who meet diagnostic criteria for Alzheimer's disease. The clinical information provider does not meet this criterion. 3. Guidelines support a PET scan for the evaluation of individuals of meet diagnostic criteria for Frontotemporal dementia. Its does not meet guidelines."  A peer to peer is an option the phone number is [phone] option 4. The case number is [id number]. It would have to be scheduled by Friday 06/10/18.

## 2018-06-07 NOTE — Telephone Encounter (Signed)
Spoke with pt's daughter Donnie Coffin (on [place]) and advised that pt's insurance declined the pet scan. Formal memory testing is needed first. She stated that the pt had refused to schedule with Dr. Kieth Brightly but they are working on talking to pt to get her to agree to schedule. I gave Lawson Fiscal the number to his office so she can call as soon as they are ready to schedule. She is aware that he is booking out a few months already. Lori verbalized appreciation for the call.

## 2018-07-11 ENCOUNTER — Ambulatory Visit: Payer: Medicare HMO | Admitting: Family Medicine

## 2018-07-12 ENCOUNTER — Ambulatory Visit: Payer: Medicare HMO | Admitting: Family Medicine

## 2018-07-28 ENCOUNTER — Telehealth: Payer: Self-pay | Admitting: Family Medicine

## 2018-07-28 NOTE — Telephone Encounter (Signed)
Called and left vm for patient to call back if need virtual visit with Dr. Ashley Royalty

## 2018-08-01 ENCOUNTER — Telehealth: Payer: Self-pay | Admitting: Family Medicine

## 2018-08-01 NOTE — Telephone Encounter (Signed)
Tried to call and see if we could reschedule appt that was canceled with Dr Ashley Royalty for a virtual visit, left message.

## 2018-08-03 ENCOUNTER — Other Ambulatory Visit: Payer: Self-pay | Admitting: *Deleted

## 2018-08-03 ENCOUNTER — Telehealth: Payer: Self-pay | Admitting: Neurology

## 2018-08-03 DIAGNOSIS — F0390 Unspecified dementia without behavioral disturbance: Secondary | ICD-10-CM

## 2018-08-03 NOTE — Telephone Encounter (Signed)
Called Dr. Marvetta Gibbons office & LVM on referral phone stating family called, pt changed her mind and wants to schedule the neuropsych testing. Referral was just sent 2 months ago. Asked for a call back if they have any questions.   Spoke with pt's daughter Lawson Fiscal and advised that I tried to reach out to Dr. Marvetta Gibbons office and LVM. Advised that referral should be good for 1 year. Lawson Fiscal will call again to schedule.

## 2018-08-03 NOTE — Telephone Encounter (Signed)
Pt's daughter Lawson Fiscal called back. Stated she had reached Dr. Marvetta Gibbons office. Referral had been closed and need new one. She asked for a note written to contact her for scheduling.  Referral sent again with daughter's name & number to contact for appts.

## 2018-08-03 NOTE — Telephone Encounter (Signed)
Pt daughter(on [place]) has called to inform pt now willing to have memory test.  When daughter called the doctor's office for the memory test referral she was told the referral has expired.  Daughter it asking if another referral can be sent and if she(Kara Solomon, Kara Solomon) can be called re: appointment time

## 2018-08-03 NOTE — Telephone Encounter (Signed)
Referral has been resent and Rodenbough's office will call daughter to schedule because patient refused last apt when they called her the first time .   General 06/01/2018 2:21 PM Jens Som - -  Note   REFERRAL TO [facility] ACCEPTED [facility] - PATIENT REFUSED SERVICE         General 06/01/2018 2:21 PM Jens Som - -  Note   REFERRAL TO [facility] ACCEPTED [facility]TONIA [facility]ERN - PATIENT REFUSED SERVICE

## 2018-08-15 ENCOUNTER — Other Ambulatory Visit: Payer: Self-pay | Admitting: Family Medicine

## 2018-08-15 MED ORDER — AMLODIPINE BESYLATE 5 MG PO TABS: 5.0000 mg | ORAL_TABLET | Freq: Every day | ORAL | 0 refills | Status: DC

## 2018-08-18 ENCOUNTER — Encounter: Payer: Medicare HMO | Attending: Psychology | Admitting: Psychology

## 2018-08-18 ENCOUNTER — Other Ambulatory Visit: Payer: Self-pay

## 2018-08-18 DIAGNOSIS — F419 Anxiety disorder, unspecified: Secondary | ICD-10-CM | POA: Diagnosis not present

## 2018-08-18 DIAGNOSIS — F32A Depression, unspecified: Secondary | ICD-10-CM

## 2018-08-18 DIAGNOSIS — F329 Major depressive disorder, single episode, unspecified: Secondary | ICD-10-CM | POA: Insufficient documentation

## 2018-08-18 DIAGNOSIS — R69 Illness, unspecified: Secondary | ICD-10-CM | POA: Diagnosis not present

## 2018-08-18 DIAGNOSIS — F0390 Unspecified dementia without behavioral disturbance: Secondary | ICD-10-CM | POA: Insufficient documentation

## 2018-08-24 ENCOUNTER — Telehealth: Payer: Self-pay | Admitting: Family Medicine

## 2018-08-24 NOTE — Telephone Encounter (Signed)
Called and could not leave vm. Calling to schedule virtual visit with Kara Solomon.

## 2018-08-29 ENCOUNTER — Encounter: Payer: Self-pay | Admitting: Psychology

## 2018-08-29 ENCOUNTER — Encounter: Payer: Medicare HMO | Attending: Psychology | Admitting: Psychology

## 2018-08-29 ENCOUNTER — Other Ambulatory Visit: Payer: Self-pay

## 2018-08-29 DIAGNOSIS — I10 Essential (primary) hypertension: Secondary | ICD-10-CM | POA: Diagnosis not present

## 2018-08-29 DIAGNOSIS — Z880 Allergy status to penicillin: Secondary | ICD-10-CM | POA: Diagnosis not present

## 2018-08-29 DIAGNOSIS — F329 Major depressive disorder, single episode, unspecified: Secondary | ICD-10-CM | POA: Diagnosis not present

## 2018-08-29 DIAGNOSIS — Z8249 Family history of ischemic heart disease and other diseases of the circulatory system: Secondary | ICD-10-CM | POA: Diagnosis not present

## 2018-08-29 DIAGNOSIS — R350 Frequency of micturition: Secondary | ICD-10-CM | POA: Diagnosis not present

## 2018-08-29 DIAGNOSIS — F419 Anxiety disorder, unspecified: Secondary | ICD-10-CM | POA: Diagnosis not present

## 2018-08-29 DIAGNOSIS — Z803 Family history of malignant neoplasm of breast: Secondary | ICD-10-CM | POA: Insufficient documentation

## 2018-08-29 DIAGNOSIS — F0390 Unspecified dementia without behavioral disturbance: Secondary | ICD-10-CM | POA: Diagnosis not present

## 2018-08-29 NOTE — Progress Notes (Addendum)
The patient arrived on time to her 8:00am testing appointment and was accompanied by her daughter. The evaluation lasted 180 minutes.  Behavioral Observations:   Appearance: Casually and appropriately dressed and groomed Gait: Ambulated independently. Speech: Clear, without word finding difficulty Thought process: Organized & linear Affect: Mild-moderately anxious and depressed Interpersonal: Pleasant, appropriate Orientation: Oriented x 4   Effort/Motivation: Good   Test Administered: Repeatable Battery for the Assessment of Neuropsychological Status (RBANS) Form BResults of the RBANS-Update (Form B):  Measure Standard or Scaled Score Percentile Description  Immediate Memory 78 7 Low Average  List Learning 6 9 Average  Story Memory 6 9 Average  Visuospatial/Constructional 92 30 Average  Figure Copy 5 5 Borderline  Line Orientation - >75 High Average  Language 113 81 High Average  Picture Naming - >75 High Average  Semantic Fluency 13 84 High Average  Attention 135 99 Very Superior   Digit Span 12 75 Average  Coding 19 99 Very Superior   Delayed Memory 81 10 Low Average  List Recall - 10-16 Low Average  List Recognition - 17-25 Low Average-Average  Story Recall 8 25 Average  Figure Recall 7 16 Low Average   Total Score 99 47 Average

## 2018-09-14 ENCOUNTER — Encounter: Payer: Self-pay | Admitting: Family Medicine

## 2018-09-14 ENCOUNTER — Ambulatory Visit (INDEPENDENT_AMBULATORY_CARE_PROVIDER_SITE_OTHER): Payer: Medicare HMO | Admitting: Family Medicine

## 2018-09-14 VITALS — BP 142/64 | HR 76 | Temp 98.1°F | Resp 20 | Ht 61.0 in | Wt 130.0 lb

## 2018-09-14 DIAGNOSIS — F329 Major depressive disorder, single episode, unspecified: Secondary | ICD-10-CM

## 2018-09-14 DIAGNOSIS — R8290 Unspecified abnormal findings in urine: Secondary | ICD-10-CM | POA: Diagnosis not present

## 2018-09-14 DIAGNOSIS — F419 Anxiety disorder, unspecified: Secondary | ICD-10-CM

## 2018-09-14 DIAGNOSIS — R69 Illness, unspecified: Secondary | ICD-10-CM | POA: Diagnosis not present

## 2018-09-14 DIAGNOSIS — I10 Essential (primary) hypertension: Secondary | ICD-10-CM | POA: Diagnosis not present

## 2018-09-14 DIAGNOSIS — R350 Frequency of micturition: Secondary | ICD-10-CM

## 2018-09-14 DIAGNOSIS — R413 Other amnesia: Secondary | ICD-10-CM

## 2018-09-14 DIAGNOSIS — F32A Depression, unspecified: Secondary | ICD-10-CM

## 2018-09-14 DIAGNOSIS — F0390 Unspecified dementia without behavioral disturbance: Secondary | ICD-10-CM

## 2018-09-14 DIAGNOSIS — E538 Deficiency of other specified B group vitamins: Secondary | ICD-10-CM | POA: Diagnosis not present

## 2018-09-14 LAB — POCT URINALYSIS DIPSTICK
Bilirubin, UA: NEGATIVE
Blood, UA: NEGATIVE
Glucose, UA: NEGATIVE
Ketones, UA: NEGATIVE
Nitrite, UA: POSITIVE
Protein, UA: NEGATIVE
Spec Grav, UA: 1.01 (ref 1.010–1.025)
Urobilinogen, UA: 0.2 E.U./dL
pH, UA: 7.5 (ref 5.0–8.0)

## 2018-09-14 LAB — COMPREHENSIVE METABOLIC PANEL
ALT: 23 U/L (ref 0–35)
AST: 26 U/L (ref 0–37)
Albumin: 4.2 g/dL (ref 3.5–5.2)
Alkaline Phosphatase: 70 U/L (ref 39–117)
BUN: 15 mg/dL (ref 6–23)
CO2: 28 mEq/L (ref 19–32)
Calcium: 9.9 mg/dL (ref 8.4–10.5)
Chloride: 104 mEq/L (ref 96–112)
Creatinine, Ser: 0.71 mg/dL (ref 0.40–1.20)
GFR: 79 mL/min (ref >60.00)
Glucose, Bld: 83 mg/dL (ref 70–99)
Potassium: 4.1 mEq/L (ref 3.5–5.1)
Sodium: 141 mEq/L (ref 135–145)
Total Bilirubin: 0.6 mg/dL (ref 0.2–1.2)
Total Protein: 7.2 g/dL (ref 6.0–8.3)

## 2018-09-14 LAB — CBC
HCT: 41.7 % (ref 36.0–46.0)
Hemoglobin: 14.2 g/dL (ref 12.0–15.0)
MCHC: 34.1 g/dL (ref 30.0–36.0)
MCV: 89.3 fl (ref 78.0–100.0)
Platelets: 174 10*3/uL (ref 150.0–400.0)
RBC: 4.67 Mil/uL (ref 3.87–5.11)
RDW: 14.1 % (ref 11.5–15.5)
WBC: 12.1 10*3/uL — ABNORMAL HIGH (ref 4.0–10.5)

## 2018-09-14 LAB — VITAMIN B12: Vitamin B-12: 301 pg/mL (ref 211–911)

## 2018-09-14 NOTE — Patient Instructions (Signed)
-  Great to see you today! -Continue current medication -We'll be in touch with lab results.

## 2018-09-14 NOTE — Progress Notes (Signed)
Please send urine for culture. Thanks!

## 2018-09-16 ENCOUNTER — Encounter: Payer: Self-pay | Admitting: Family Medicine

## 2018-09-16 ENCOUNTER — Other Ambulatory Visit: Payer: Self-pay | Admitting: Family Medicine

## 2018-09-16 DIAGNOSIS — R350 Frequency of micturition: Secondary | ICD-10-CM | POA: Insufficient documentation

## 2018-09-16 LAB — URINE CULTURE
MICRO NUMBER:: 510058
SPECIMEN QUALITY:: ADEQUATE

## 2018-09-16 MED ORDER — CEPHALEXIN 500 MG PO CAPS: 500.0000 mg | ORAL_CAPSULE | Freq: Two times a day (BID) | ORAL | 0 refills | Status: AC

## 2018-09-16 NOTE — Assessment & Plan Note (Signed)
Check UA 

## 2018-09-16 NOTE — Assessment & Plan Note (Signed)
-  BP remains stable with amlodipine, continue

## 2018-09-16 NOTE — Progress Notes (Signed)
Culture + for UTI.  Antibiotics sent over to pharmacy.  B12 levels are stable, I think we can hold off on continued injections for now but I would recommend a oral b12 supplement at daily

## 2018-09-16 NOTE — Progress Notes (Signed)
Kara Solomon - 83 y.o. female MRN [id number]  Date of birth: 1935-09-03  Subjective Chief Complaint  Patient presents with  . Follow-up    $ mo F/Y HTN/Memory loss     HPI Kara Dawleylsie Abts is a 83 y.o. female here today for f/u of HTN and memory issues. She is accompanied by her daughter today.  Since her last visit with me she has seen neurology and referred to neuropsych for cognitive testing which she had completed a couple of weeks ago.  She has not been started on any medication such as aricept or namenda.  Memory loss is stable at this time, no worsening but no improvement.  B12 levels low prevously and had been receiving injections but hasn't gotten in the past few months. She thinks she may have another UTI as well.  She denies dysuria but has had some frequency.  She does get lonely at times since her husband passed away and feels down at times but doesn't feel overly depressed.  She has a cat as a companion and another daughter lives close by.  Her BP has remained well controlled with amlodipine.   ROS:  A comprehensive ROS was completed and negative except as noted per HPI  Allergies  Allergen Reactions  . Penicillins Hives    Has patient had a PCN reaction causing immediate rash, facial/tongue/throat swelling, SOB or lightheadedness with hypotension: Unknown Has patient had a PCN reaction causing severe rash involving mucus membranes or skin necrosis: Unknown Has patient had a PCN reaction that required hospitalization: Unknown Has patient had a PCN reaction occurring within the last 10 years: No If all of the above answers are "NO", then may proceed with Cephalosporin use.     Past Medical History:  Diagnosis Date  . Borderline hyperglycemia   . Cataract   . HTN (hypertension)     Past Surgical History:  Procedure Laterality Date  . CATARACT EXTRACTION, BILATERAL    . TONSILLECTOMY      Social History   Socioeconomic History  . Marital status: Widowed    Spouse  name: Not on file  . Number of children: 3  . Years of education: Not on file  . Highest education level: High school graduate  Occupational History  . Not on file  Social Needs  . Financial resource strain: Not on file  . Food insecurity:    Worry: Not on file    Inability: Not on file  . Transportation needs:    Medical: Not on file    Non-medical: Not on file  Tobacco Use  . Smoking status: Never Smoker  . Smokeless tobacco: Never Used  Substance and Sexual Activity  . Alcohol use: No    Alcohol/week: 0.0 standard drinks  . Drug use: No  . Sexual activity: Not on file  Lifestyle  . Physical activity:    Days per week: Not on file    Minutes per session: Not on file  . Stress: Not on file  Relationships  . Social connections:    Talks on phone: Not on file    Gets together: Not on file    Attends religious service: Not on file    Active member of club or organization: Not on file    Attends meetings of clubs or organizations: Not on file    Relationship status: Not on file  Other Topics Concern  . Not on file  Social History Narrative   Lives at home alone   Right handed  Caffeine: none    Family History  Problem Relation Age of Onset  . Heart disease Sister   . Heart failure Mother        died in early 25s  . Stroke Father 57  . Dementia Maternal Grandmother   . Breast cancer Daughter     Health Maintenance  Topic Date Due  . TETANUS/TDAP  08/27/1956  . DEXA SCAN  08/28/2002  . PNA vac Low Risk Adult (1 of 2 - PCV13) 08/28/2002  . INFLUENZA VACCINE  11/19/2018    ----------------------------------------------------------------------------------------------------------------------------------------------------------------------------------------------------------------- Physical Exam BP (!) 142/64   Pulse 76   Temp 98.1 F (36.7 C) (Oral)   Resp 20   Ht 5\' 1"  (1.549 m)   Wt 130 lb (59 kg)   SpO2 97%   BMI 24.56 kg/m   Physical Exam  Constitutional:      Appearance: Normal appearance.  HENT:     Head: Normocephalic and atraumatic.     Mouth/Throat:     Mouth: Mucous membranes are moist.  Eyes:     General: No scleral icterus. Neck:     Musculoskeletal: Neck supple.  Cardiovascular:     Rate and Rhythm: Normal rate and regular rhythm.  Pulmonary:     Effort: Pulmonary effort is normal.     Breath sounds: Normal breath sounds.  Skin:    General: Skin is warm and dry.     Findings: No rash.  Neurological:     General: No focal deficit present.     Mental Status: She is alert.  Psychiatric:        Mood and Affect: Mood normal.        Behavior: Behavior normal.     ------------------------------------------------------------------------------------------------------------------------------------------------------------------------------------------------------------------- Assessment and Plan  Essential hypertension -BP remains stable with amlodipine, continue   Dementia without behavioral disturbance (HCC) -Followed by neuro now as well, awaiting results of neurocognitive testing.   -Recheck B12 levels   Anxiety and depression -Stable, declines counseling or medication.   Urinary frequency Check UA

## 2018-09-16 NOTE — Assessment & Plan Note (Signed)
-  Stable, declines counseling or medication.

## 2018-09-16 NOTE — Assessment & Plan Note (Signed)
-  Followed by neuro now as well, awaiting results of neurocognitive testing.   -Recheck B12 levels

## 2018-09-22 NOTE — Progress Notes (Signed)
Neuropsychological Consultation   Patient:   Kara Solomon   DOB:   [birth year]  MR Number:  [id number]  Location:  [facility] PHYS[facility] MEDICINE AND REHABILITATION [address] [place], [address] [id number] [facility] [place] [address] Dept: [phone]           Date of Service:   08/18/2018  Today's visit was conducted via WebEx.  The patient's identity was identified with 2 independent verifiers.  The patient and her daughter were present at the patient's house and I was in my outpatient clinic office.  The visit had good audio and video quality throughout the visit.  The first hour was spent on WebEx with the patient and her daughter in the second hour consisted of records review and report writing.  Start Time:   3 PM End Time:   4 PM  Provider/Observer:  Kara Solomon, Psy.D.       Clinical Neuropsychologist       Billing Code/Service: [phone], [phone]  Chief Complaint:    Kara Solomon is an 83 year old female referred by Dr. Lucia Solomon for neuropsychological evaluation.  The patient is described to be having short-term memory and confusion issues and trouble with word finding and following conversations.  Reason for Service:  Kara Solomon is an 83 year old female referred by Dr. Lucia Solomon for neuropsychological evaluation.  The patient is described to be having short-term memory and confusion issues and trouble with word finding and following conversations.  The patient's daughter reports that she is noticed short-term memory difficulties for years now.  There have been times of visual-spatial deficits and geographic disorientation with the symptoms becoming significantly exacerbated after the patient's husband died in December 27, 2017.  There have been significant stressors in the patient's life over the past several years.  4 years ago the patient's sister-in-law committed suicide and 2 years ago her daughter was diagnosed with breast cancer.   Then in 09/09/19the patient's husband passed away from cancer in the family did not know that he even had cancer until the time of his passing.  According to Dr. Trevor Solomon notes the patient has been getting injections for B12 deficiency.  The patient was described by her daughter at that time is getting easily confused and difficulty following conversations which is consistent with what was conveyed today.  The patient has had difficulty remembering how to pump gas although the patient made an excuse feeling like every gas station is different and it is confused her.  The patient is had difficulty getting to familiar places such as restaurant.  The patient does continue to live alone.  The patient's son-in-law pays her bills but she does some cleaning around the house.  The patient stopped cooking.  There have been no accidents in the home or difficulty in the home.  There have also been some significant issues of depression.  There was an MRI of the brain conducted in 2019 that have the interpretation of normal examination for age.  Current Status:  The patient is described as having short-term memory difficulties, confusion and disorientation, progressive worsening of symptoms, executive functioning deficits, expressive language changes with significant word finding and geographic disorientation.  Reliability of Information: The information is derived from 1 hour WebEx interview with the patient and her daughter as well as review of available medical records.  Behavioral Observation: Kara Solomon  presents as a 83 y.o.-year-old Right Caucasian Female who appeared her stated age. her dress was Appropriate and  she was Well Groomed and her manners were Appropriate to the situation.  her participation was indicative of Appropriate and Redirectable behaviors.  There were not any physical disabilities noted.  she displayed an appropriate level of cooperation and motivation.     Interactions:    Active  Appropriate and Inattentive  Attention:   abnormal and attention span appeared shorter than expected for age  Memory:   abnormal; remote memory intact, recent memory impaired  Visuo-spatial:  not examined  Speech (Volume):  low  Speech:   normal; significant word finding difficulties  Thought Process:  Coherent and Relevant  Though Content:  WNL; not suicidal and not homicidal  Orientation:   person, place and time/date  Judgment:   Fair  Planning:   Fair  Affect:    Anxious and Depressed  Mood:    Anxious and Depressed  Insight:   Fair  Intelligence:   normal  Marital Status/Living: The patient was born and raised in [facility] [facility] [place] and had 3 siblings.  The patient currently lives alone and has done so since her husband passed away in 12-23-17.  The patient has 3 daughters.  Current Employment: The patient is retired and has been retired for 11 years.  Past Employment:  The patient worked in accounts payable as a Solicitor.  She did her longest job at a particular spot of 12 years.  Hobbies and interests include knitting, word puzzles and watching TV.  Substance Use:  No concerns of substance abuse are reported.    Education:   HS Graduate  Medical History:   Past Medical History:  Diagnosis Date  . Borderline hyperglycemia   . Cataract   . HTN (hypertension)    Psychiatric History:  The patient denies any significant prior psychiatric history.  Family Med/Psych History:  Family History  Problem Relation Age of Onset  . Heart disease Sister   . Heart failure Mother        died in early 16s  . Stroke Father 42  . Dementia Maternal Grandmother   . Breast cancer Daughter     Risk of Suicide/Violence: virtually non-existent the patient denies any suicidal or homicidal ideation.  Impression/DX:  Kara Solomon is an 83 year old female referred by Dr. Lucia Solomon for neuropsychological evaluation.  The patient is described to be having short-term memory and confusion  issues and trouble with word finding and following conversations.  The patient's daughter reports that she is noticed short-term memory difficulties for years now.  There have been times of visual-spatial deficits and geographic disorientation with the symptoms becoming significantly exacerbated after the patient's husband died in 2017-12-23.  There have been significant stressors in the patient's life over the past several years.  4 years ago the patient's sister-in-law committed suicide and 2 years ago her daughter was diagnosed with breast cancer.  Then in September 05, 2019the patient's husband passed away from cancer in the family did not know that he even had cancer until the time of his passing.  According to Dr. Trevor Solomon notes the patient has been getting injections for B12 deficiency.  The patient was described by her daughter at that time is getting easily confused and difficulty following conversations which is consistent with what was conveyed today.  The patient has had difficulty remembering how to pump gas although the patient made an excuse feeling like every gas station is different and it is confused her.  The patient is had difficulty getting to familiar places such as restaurant.  The patient does continue to live alone.  The patient's son-in-law pays her bills but she does some cleaning around the house.  The patient stopped cooking.  There have been no accidents in the home or difficulty in the home.  There have also been some significant issues of depression.  There was an MRI of the brain conducted in 2019 that have the interpretation of normal examination for age.  Disposition/Plan:  We have set the patient up for formal neuropsychological evaluation and initially will be administered the RBANS repeatable neuropsychological test battery and at that point a determination will be made as to whether further testing will be needed once we get this broad assessment of overall cognitive  functioning and memory functioning.  Diagnosis:    Dementia without behavioral disturbance, unspecified dementia type (HCC)  Anxiety and depression         Electronically Signed   _______________________ Kara PhenixJohn , Psy.D.

## 2018-11-11 ENCOUNTER — Encounter: Payer: Self-pay | Admitting: Psychology

## 2018-11-11 ENCOUNTER — Other Ambulatory Visit: Payer: Self-pay

## 2018-11-11 ENCOUNTER — Encounter: Payer: Medicare HMO | Attending: Psychology | Admitting: Psychology

## 2018-11-11 DIAGNOSIS — F329 Major depressive disorder, single episode, unspecified: Secondary | ICD-10-CM | POA: Insufficient documentation

## 2018-11-11 DIAGNOSIS — Z8249 Family history of ischemic heart disease and other diseases of the circulatory system: Secondary | ICD-10-CM | POA: Insufficient documentation

## 2018-11-11 DIAGNOSIS — R413 Other amnesia: Secondary | ICD-10-CM | POA: Diagnosis not present

## 2018-11-11 DIAGNOSIS — Z880 Allergy status to penicillin: Secondary | ICD-10-CM | POA: Diagnosis not present

## 2018-11-11 DIAGNOSIS — Z803 Family history of malignant neoplasm of breast: Secondary | ICD-10-CM | POA: Diagnosis not present

## 2018-11-11 DIAGNOSIS — I10 Essential (primary) hypertension: Secondary | ICD-10-CM | POA: Diagnosis not present

## 2018-11-11 DIAGNOSIS — R350 Frequency of micturition: Secondary | ICD-10-CM | POA: Diagnosis not present

## 2018-11-11 DIAGNOSIS — F0390 Unspecified dementia without behavioral disturbance: Secondary | ICD-10-CM | POA: Diagnosis not present

## 2018-11-11 DIAGNOSIS — F419 Anxiety disorder, unspecified: Secondary | ICD-10-CM | POA: Insufficient documentation

## 2018-11-11 DIAGNOSIS — F32A Depression, unspecified: Secondary | ICD-10-CM

## 2018-11-11 DIAGNOSIS — R69 Illness, unspecified: Secondary | ICD-10-CM | POA: Diagnosis not present

## 2018-11-11 NOTE — Progress Notes (Addendum)
Neuropsychological Evaluation  Patient:  Kara Solomon   DOB: [birth year]  MR Number: [id number]  Location: [facility] [place], [place] [id number] [facility] [place] [address] Dept: [phone]  Start: 8 AM End: 9 AM   Provider/Observer:     Edgardo Roys PsyD  Chief Complaint:      Chief Complaint  Patient presents with  . Memory Loss    Reason For Service:     Kara Solomon is an 83 year old female referred by Dr. Jaynee Eagles for neuropsychological evaluation and was initially referred to neurological evaluation by Kara Nutting, DO.  The patient is described to be having short-term memory loss and confusion issues along with trouble with word finding and following conversations.  The patient's daughter reports that she has noticed short-term memory difficulties for years now.  There have been times of visual-spatial deficits and geographic disorientation with the symptoms becoming significantly exacerbated after the patient's husband died in 2017/12/13.  There have been significant stressors in the patient's life over the past several years.  4 years ago the patient's sister-in-law committed suicide and 2 years ago her daughter was diagnosed with breast cancer.  Then in 12-13-17 the patient's husband passed away from cancer and the family did not know that he even had cancer until the time of his passing.  According to Dr. Cathren Laine notes the patient has been getting injections for B12 deficiency.  The patient was described by her daughter at that time is getting easily confused and difficulty following conversations, which is consistent with what was conveyed today.  The patient has had difficulty remembering how to pump gas although the patient made an excuse feeling like every gas station is different and it confused her.  The patient has had difficulty getting to familiar places  such as a restaurant she has gone to for years.  The patient does continue to live alone.  The patient's son-in-law pays her bills but she does some cleaning around the house.  The patient stopped cooking.  There have been no accidents in the home or difficulty in the home.  There have also been some significant issues of depression.  There was an MRI of the brain conducted in 2019 that have the interpretation of normal examination for age.  The patient is described as having progressively worsening short-term memory difficulties, confusion and disorientation,  executive functioning deficits, expressive language changes with significant word finding and geographic disorientation.  Behavioral Observations:  Appearance:Casually and appropriately dressedand groomed Gait:Ambulated independently. Speech:Clear, without word finding difficulty Thought process:Organized & linear Affect:Mild-moderately anxious and depressed Interpersonal: Pleasant, appropriate Orientation: Oriented x 4  Effort/Motivation: Good   Test Administered: Repeatable Battery for the Assessment of Neuropsychological Status (RBANS) Form B :   Test Results:  Measure Standard or Scaled Score Percentile Description  Immediate Memory 78 7 Low Average  List Learning 6 9  low average  Story Memory 6 9  low average  Visuospatial/Constructional 92 30 Average  Figure Copy 5 5 Borderline  Line Orientation - >75 High Average  Language 113 81 High Average  Picture Naming - >75 High Average  Semantic Fluency 13 84 High Average  Attention 135 99 Very Superior   Digit Span 12 75 Average  Coding 19 99 Very Superior   Delayed Memory 81 10 Low Average  List Recall - 10-16 Low Average  List Recognition - 17-25 Low Average-Average  Story Recall 8  25 Average  Figure Recall 7 16 Low Average   Total Score 99 47 Average          RBANS Update Form B Total Scale 99  The patient produced a total scale index score of 99  which falls at the 50th percentile and within normal limits overall.  However, there was considerable variability within subtest performance with impaired scores with regard to delayed memory and immediate memory and exceptional functioning for measures of attention and concentration and information processing speed as well as expressive language abilities.  Average performances were noted with regard to visual spatial/constructional abilities.       RBANS Update Form B Attention 135 Coding19 Digit Span12  The patient produced an attention index score of 135, which is in the superior range of functioning and indicative of excellent abilities with regard to auditory encoding as well as focus execute abilities including visual scanning and information processing speed visually.  The patient is showing no indication of any deficits with regard to encoding abilities.        RBANS Update Form B Immediate Memory 78 List Learning6 Story Memory6  The patient produced an immediate memory index score of 78 which falls in the low average range of functioning and is indicative of mild deficits with regard to her ability to initially learn complex and simple verbal information and complex and simple visual information.  The patient had difficulty both with nonrelated list of words as well as story memory.  However, given the patient's excellent score with regard to auditory encoding abilities this immediate memory score is indicative of rather abrupt loss of initially encoded and attended to information.        RBANS Update Form B Visuospatial/ Constructional 92 Figure Copy5 Line Orientation>75  The patient produced a visual spatial/constructional index score of 92 which falls in the lower end of the average range.  There was considerable variability within the subtest make up this index score.  For example, the patient produced an impaired score with regard to her visual constructional task related to  copying a moderately complex two-dimensional figure.  The patient did quite well on her visual spatial measure that required no physical motor input that allowed excellent abilities with regard to visual estimation and judgment skills.        RBANS Update Form B Language 113 Semantic [id number] Picture Naming>75  The patient produced a language index score of 113 which falls in the high average range and strongly suggest good expressive language functioning and the ability to produce fluent self generated language as well as show good naming and repetition functions overall.  Expressive language was again overall in the high average range.     RBANS Update Form B Delayed Memory 81 List Recall10-16 Story Recall8 Figure Recall7 List Recognition17-25  The patient produced a delayed memory index score of 81 which falls in the low average range of functioning.  The patient had mild difficulties recalling the previously learned list of words that were not related to one another as well as mild difficulties recalling that list and a recognition format allowing for cueing.  The patient produced a performance in the lower end of the average range with regard to recalling the related information within a story and low average performance with regard to recall of previously learned and practiced two-dimensional figure.  Summary of Results:   Lynwood Dawleylsie Busenbark produce significant variability with an this assessment of a broad range of cognitive functioning and cognitive  domains.  The patient produced superior/excellent performance with regard to expressive language abilities and especially superior performance with regard to measures of encoding and information processing speed/visual scanning and searching abilities.  The patient also produced very good performance with regard to visual spatial/visual analysis abilities but had some difficulties with fine motor control and figure copying abilities/visual  constructional abilities.  The patient also displayed high average abilities for both verbal fluency as well as challenge naming task and there were no indications of word finding difficulties are verbal fluency issues.  The only areas assessed that showed any type of mild impairment beyond fine motor control issues or measures of immediate memory and delayed memory.  Both of these generally fell in the low average range of functioning.  However, given the patient's excellent auditory encoding abilities there does appear to be a fairly rapid loss or difficulty in transferring information from initial sensory storage to short-term memory capacity.  When reassessing for degree of learning after period of delay, the patient does appear to adequately retain the information that she was able to initially recall after brief delay suggesting that the information that is stored initially is effectively managed and retained over time.  Impression/Diagnosis:   Overall, the results of the current neuropsychological including both clinical information, and neuro imaging as well as objective cognitive functioning do suggest that there are some isolated and specific issues related to learning new information both visual and Auditorily.  These weaknesses and deficits are not severe or significant.  The patient shows excellent, well retained abilities with regard to visual spatial abilities, expressive language abilities, excellent abilities with regard to attention and concentration and auditory encoding as well as excellent information processing speed functions.  The patient has had some difficulty with executive functioning types of life skills and tasks.  However, the patient's reported difficulty remembering how to get to very familiar places is in some contrast with only mild difficulties on a visual spatial and constructional abilities measures.  The patient is functioning in the superior range with regard to her encoding  subtest and good reasoning and problem-solving abilities.  While the mild memory deficits noted would be consistent with neurocognitive deficits especially given the fact that she has such excellent encoding and attending capacity.  The patient is described as having significant distress over the past several years with numerous family members having significant medical issues and her husband passing away not too long ago.  While it is possible that we are picking up some aspects of progressive memory loss, the pattern of cognitive strengths and weaknesses are not those typically seen with conditions such as Alzheimer's dementia but this may be clouded by previous excellent abilities in some cognitive domains.  I do think that issues such as anxiety and depression and the neurological effects that anxiety and depression and potential sleep disturbance can play on memory functions and acute executive functioning's need to be strongly considered in this case.  It will be important to perform repeat testing in approximately 9 months to assess for any progressive changes with the patient.  Recommendations:   While progressive dementia such as Alzheimer's type dementia cannot be ruled out with the current clinical and objective neuropsychological data there are inconsistencies in her cognitive functioning with typical patterns of Alzheimer's or other progressive cortical dementias.  The patient has had some significant acute difficulties that the family is concerned about but she is continuing to live alone and manage her day-to-day activities excluding taking care  of her bills and other major activities.  The patient is showing some fine motor control and visual constructional weaknesses as well as some mild immediate memory deficits and to a lesser extent mild delayed memory deficits.  However, she showed excellent encoding abilities and superior information processing speed and rapid decision making/executive  functioning.  Given this overall pattern I think will be important for us to actively address issues of depression and anxiety along with his deleterious effects on sleep as they may be playing a particularly significant role in the patient's real life difficulties.  We will need repeat testing in approximately 9 months to assess for further decline in memory functions and other cognitive functions to gain a better picture whether a progressive degenerative dementia such as Alzheimer's is present.    Diagnosis:  Anxiety and depression along with mild memory deficits  A definitive diagnosis of Alzheimer's or other progressive dementias cannot be made based on the objective neuropsychological and cognitive functioning.  We will need repeat testing in approximately 9 months to assess for progressive changes.   Arley PhenixJohn Lanita Stammen, Psy.D. Neuropsychologist

## 2018-11-14 ENCOUNTER — Other Ambulatory Visit: Payer: Self-pay

## 2018-11-14 ENCOUNTER — Encounter (HOSPITAL_BASED_OUTPATIENT_CLINIC_OR_DEPARTMENT_OTHER): Payer: Medicare HMO | Admitting: Psychology

## 2018-11-14 DIAGNOSIS — I10 Essential (primary) hypertension: Secondary | ICD-10-CM | POA: Diagnosis not present

## 2018-11-14 DIAGNOSIS — Z880 Allergy status to penicillin: Secondary | ICD-10-CM | POA: Diagnosis not present

## 2018-11-14 DIAGNOSIS — Z803 Family history of malignant neoplasm of breast: Secondary | ICD-10-CM | POA: Diagnosis not present

## 2018-11-14 DIAGNOSIS — F419 Anxiety disorder, unspecified: Secondary | ICD-10-CM | POA: Diagnosis not present

## 2018-11-14 DIAGNOSIS — F329 Major depressive disorder, single episode, unspecified: Secondary | ICD-10-CM

## 2018-11-14 DIAGNOSIS — F0390 Unspecified dementia without behavioral disturbance: Secondary | ICD-10-CM | POA: Diagnosis not present

## 2018-11-14 DIAGNOSIS — R413 Other amnesia: Secondary | ICD-10-CM | POA: Diagnosis not present

## 2018-11-14 DIAGNOSIS — R350 Frequency of micturition: Secondary | ICD-10-CM | POA: Diagnosis not present

## 2018-11-14 DIAGNOSIS — R69 Illness, unspecified: Secondary | ICD-10-CM | POA: Diagnosis not present

## 2018-11-14 DIAGNOSIS — F32A Depression, unspecified: Secondary | ICD-10-CM

## 2018-11-14 DIAGNOSIS — Z8249 Family history of ischemic heart disease and other diseases of the circulatory system: Secondary | ICD-10-CM | POA: Diagnosis not present

## 2018-11-15 ENCOUNTER — Other Ambulatory Visit: Payer: Self-pay | Admitting: Family Medicine

## 2018-11-15 MED ORDER — AMLODIPINE BESYLATE 5 MG PO TABS: 5.0000 mg | ORAL_TABLET | Freq: Every day | ORAL | 0 refills | Status: DC

## 2018-11-15 NOTE — Telephone Encounter (Signed)
Refill sent to pharmacy as requested

## 2018-11-15 NOTE — Telephone Encounter (Signed)
Medication Refill - Medication: amLODipine (NORVASC) 5 MG tablet    Has the patient contacted their pharmacy? Yes.   (Agent: If no, request that the patient contact the pharmacy for the refill.) (Agent: If yes, when and what did the pharmacy advise?)  Preferred Pharmacy (with phone number or street name):  [facility] ([place]), [place] - [address]  [address] [place] ([place]) [place] [address]  Phone: [phone] Fax: [phone]   Agent: Please be advised that RX refills may take up to 3 business days. We ask that you follow-up with your pharmacy.

## 2018-11-16 NOTE — Progress Notes (Signed)
Today I provided feedback regarding the results of the recent neuropsychological evaluation below of included the summary of the full neuropsychological evaluation that can be found in her medical chart for the visit dated 11/11/2018.  Summary of Results:                        Lynwood Dawleylsie Kilcrease produce significant variability with an this assessment of a broad range of cognitive functioning and cognitive domains.  The patient produced superior/excellent performance with regard to expressive language abilities and especially superior performance with regard to measures of encoding and information processing speed/visual scanning and searching abilities.  The patient also produced very good performance with regard to visual spatial/visual analysis abilities but had some difficulties with fine motor control and figure copying abilities/visual constructional abilities.  The patient also displayed high average abilities for both verbal fluency as well as challenge naming task and there were no indications of word finding difficulties are verbal fluency issues.  The only areas assessed that showed any type of mild impairment beyond fine motor control issues or measures of immediate memory and delayed memory.  Both of these generally fell in the low average range of functioning.  However, given the patient's excellent auditory encoding abilities there does appear to be a fairly rapid loss or difficulty in transferring information from initial sensory storage to short-term memory capacity.  When reassessing for degree of learning after period of delay, the patient does appear to adequately retain the information that she was able to initially recall after brief delay suggesting that the information that is stored initially is effectively managed and retained over time.  Impression/Diagnosis:                     Overall, the results of the current neuropsychological including both clinical information, and neuro imaging as  well as objective cognitive functioning do suggest that there are some isolated and specific issues related to learning new information both visual and Auditorily.  These weaknesses and deficits are not severe or significant.  The patient shows excellent, well retained abilities with regard to visual spatial abilities, expressive language abilities, excellent abilities with regard to attention and concentration and auditory encoding as well as excellent information processing speed functions.  The patient has had some difficulty with executive functioning types of life skills and tasks.  However, the patient's reported difficulty remembering how to get to very familiar places is in some contrast with only mild difficulties on a visual spatial and constructional abilities measures.  The patient is functioning in the superior range with regard to her encoding subtest and good reasoning and problem-solving abilities.  While the mild memory deficits noted would be consistent with neurocognitive deficits especially given the fact that she has such excellent encoding and attending capacity.  The patient is described as having significant distress over the past several years with numerous family members having significant medical issues and her husband passing away not too long ago.  While it is possible that we are picking up some aspects of progressive memory loss, the pattern of cognitive strengths and weaknesses are not those typically seen with conditions such as Alzheimer's dementia but this may be clouded by previous excellent abilities in some cognitive domains.  I do think that issues such as anxiety and depression and the neurological effects that anxiety and depression and potential sleep disturbance can play on memory functions and acute executive functioning's need to be strongly  considered in this case.  It will be important to perform repeat testing in approximately 9 months to assess for any progressive  changes with the patient.  Recommendations:                          While progressive dementia such as Alzheimer's type dementia cannot be ruled out with the current clinical and objective neuropsychological data there are inconsistencies in her cognitive functioning with typical patterns of Alzheimer's or other progressive cortical dementias.  The patient has had some significant acute difficulties that the family is concerned about but she is continuing to live alone and manage her day-to-day activities excluding taking care of her bills and other major activities.  The patient is showing some fine motor control and visual constructional weaknesses as well as some mild immediate memory deficits and to a lesser extent mild delayed memory deficits.  However, she showed excellent encoding abilities and superior information processing speed and rapid decision making/executive functioning.  Given this overall pattern I think will be important for [place] to actively address issues of depression and anxiety along with his deleterious effects on sleep as they may be playing a particularly significant role in the patient's real life difficulties.  We will need repeat testing in approximately 9 months to assess for further decline in memory functions and other cognitive functions to gain a better picture whether a progressive degenerative dementia such as Alzheimer's is present.    Diagnosis:                             Anxiety and depression along with mild memory deficits  A definitive diagnosis of Alzheimer's or other progressive dementias cannot be made based on the objective neuropsychological and cognitive functioning.  We will need repeat testing in approximately 9 months to assess for progressive changes.   Ilean Skill, Psy.D. Neuropsychologist

## 2019-03-20 ENCOUNTER — Encounter: Payer: Self-pay | Admitting: Family Medicine

## 2019-03-20 ENCOUNTER — Other Ambulatory Visit: Payer: Self-pay

## 2019-03-20 ENCOUNTER — Ambulatory Visit (INDEPENDENT_AMBULATORY_CARE_PROVIDER_SITE_OTHER): Payer: Medicare HMO | Admitting: Family Medicine

## 2019-03-20 VITALS — BP 128/56 | HR 87 | Temp 98.1°F | Resp 16 | Ht 61.0 in | Wt 127.6 lb

## 2019-03-20 DIAGNOSIS — I10 Essential (primary) hypertension: Secondary | ICD-10-CM

## 2019-03-20 DIAGNOSIS — Z23 Encounter for immunization: Secondary | ICD-10-CM

## 2019-03-20 DIAGNOSIS — R69 Illness, unspecified: Secondary | ICD-10-CM | POA: Diagnosis not present

## 2019-03-20 DIAGNOSIS — R413 Other amnesia: Secondary | ICD-10-CM | POA: Diagnosis not present

## 2019-03-20 DIAGNOSIS — F329 Major depressive disorder, single episode, unspecified: Secondary | ICD-10-CM | POA: Diagnosis not present

## 2019-03-20 DIAGNOSIS — F32A Depression, unspecified: Secondary | ICD-10-CM

## 2019-03-20 DIAGNOSIS — F419 Anxiety disorder, unspecified: Secondary | ICD-10-CM | POA: Diagnosis not present

## 2019-03-20 MED ORDER — MIRTAZAPINE 7.5 MG PO TABS: 7.5000 mg | ORAL_TABLET | Freq: Every day | ORAL | 1 refills | Status: DC

## 2019-03-20 NOTE — Assessment & Plan Note (Signed)
Discussed with her that I think medication may be helpful for how she is feeling.  She is reluctant but agrees to try medication.  I think mirtazapine is a good option given her decreased appetite and poor sleep.  Will monitor weight closely and assess for cognitive improvement. F/u in 6 weeks.

## 2019-03-20 NOTE — Assessment & Plan Note (Signed)
BP is well controlled at this time, continue amlodipine at current dose.

## 2019-03-20 NOTE — Progress Notes (Signed)
Kara Solomon - 83 y.o. female MRN [id number]  Date of birth: 1936/02/14  Subjective Chief Complaint  Patient presents with  . 6 month follow up  . Flu Vaccine    would like today     HPI Kara Solomon is a 83 y.o. female here today for follow up of HTN and MCI.  She is accompanied by her daughter, Kara Solomon today.   She reports that her other daughter unfortunately passed away about 2 weeks ago after complications from cancer.  She has had some difficulty adjusting to this as well as her husband passing away last year.  She had cognitive evaluation and it was felt that underlying depression and insomnia were contributing to her mild cognitive impairment.  Recommendations were to repeat this in 9 months.  Her daughter states that memory impairment is stable.  She is currently taking melatonin for sleep and reports this is helpful for sleep onset but she typically wakes up after 4 hours and has trouble going back to sleep.  She also has diminished appetite and weight is down 3 lbs since last visit in May.  She is reluctant to try medication for this but will consider if I think it may be helpful.  In regards to HTN her BP remains well controlled with amlodipine.  She denies s/s of hypotension.  She has not had chest pain, shortness of breath, palpitations, headache or vision changes.     ROS:  A comprehensive ROS was completed and negative except as noted per HPI   Allergies  Allergen Reactions  . Penicillins Hives    Has patient had a PCN reaction causing immediate rash, facial/tongue/throat swelling, SOB or lightheadedness with hypotension: Unknown Has patient had a PCN reaction causing severe rash involving mucus membranes or skin necrosis: Unknown Has patient had a PCN reaction that required hospitalization: Unknown Has patient had a PCN reaction occurring within the last 10 years: No If all of the above answers are "NO", then may proceed with Cephalosporin use.     Past Medical History:   Diagnosis Date  . Borderline hyperglycemia   . Cataract   . HTN (hypertension)     Past Surgical History:  Procedure Laterality Date  . CATARACT EXTRACTION, BILATERAL    . TONSILLECTOMY      Social History   Socioeconomic History  . Marital status: Widowed    Spouse name: Not on file  . Number of children: 3  . Years of education: Not on file  . Highest education level: High school graduate  Occupational History  . Not on file  Social Needs  . Financial resource strain: Not on file  . Food insecurity    Worry: Not on file    Inability: Not on file  . Transportation needs    Medical: Not on file    Non-medical: Not on file  Tobacco Use  . Smoking status: Never Smoker  . Smokeless tobacco: Never Used  Substance and Sexual Activity  . Alcohol use: No    Alcohol/week: 0.0 standard drinks  . Drug use: No  . Sexual activity: Not on file  Lifestyle  . Physical activity    Days per week: Not on file    Minutes per session: Not on file  . Stress: Not on file  Relationships  . Social Musician on phone: Not on file    Gets together: Not on file    Attends religious service: Not on file    Active  member of club or organization: Not on file    Attends meetings of clubs or organizations: Not on file    Relationship status: Not on file  Other Topics Concern  . Not on file  Social History Narrative   Lives at home alone   Right handed   Caffeine: none    Family History  Problem Relation Age of Onset  . Heart disease Sister   . Heart failure Mother        died in early 1s  . Stroke Father 41  . Dementia Maternal Grandmother   . Breast cancer Daughter     Health Maintenance  Topic Date Due  . TETANUS/TDAP  08/27/1956  . DEXA SCAN  08/28/2002  . PNA vac Low Risk Adult (1 of 2 - PCV13) 08/28/2002  . INFLUENZA VACCINE  11/19/2018     ----------------------------------------------------------------------------------------------------------------------------------------------------------------------------------------------------------------- Physical Exam BP (!) 128/56   Pulse 87   Temp 98.1 F (36.7 C)   Resp 16   Ht 5\' 1"  (1.549 m)   Wt 127 lb 9.6 oz (57.9 kg)   SpO2 97%   BMI 24.11 kg/m   Physical Exam Constitutional:      Appearance: Normal appearance.  HENT:     Head: Normocephalic and atraumatic.     Mouth/Throat:     Mouth: Mucous membranes are moist.  Cardiovascular:     Rate and Rhythm: Normal rate and regular rhythm.  Pulmonary:     Effort: Pulmonary effort is normal.     Breath sounds: Normal breath sounds.  Musculoskeletal:     Right lower leg: No edema.     Left lower leg: No edema.  Skin:    General: Skin is warm and dry.  Neurological:     General: No focal deficit present.     Mental Status: She is alert and oriented to person, place, and time.  Psychiatric:        Mood and Affect: Mood normal.        Behavior: Behavior normal.     ------------------------------------------------------------------------------------------------------------------------------------------------------------------------------------------------------------------- Assessment and Plan  Anxiety and depression Discussed with her that I think medication may be helpful for how she is feeling.  She is reluctant but agrees to try medication.  I think mirtazapine is a good option given her decreased appetite and poor sleep.  Will monitor weight closely and assess for cognitive improvement. F/u in 6 weeks.   Essential hypertension BP is well controlled at this time, continue amlodipine at current dose.   This visit occurred during the SARS-CoV-2 public health emergency.  Safety protocols were in place, including screening questions prior to the visit, additional usage of staff PPE, and extensive cleaning of exam room  while observing appropriate contact time as indicated for disinfecting solutions.

## 2019-03-20 NOTE — Patient Instructions (Signed)

## 2019-03-28 ENCOUNTER — Ambulatory Visit (INDEPENDENT_AMBULATORY_CARE_PROVIDER_SITE_OTHER): Payer: Medicare HMO

## 2019-03-28 ENCOUNTER — Other Ambulatory Visit: Payer: Self-pay

## 2019-03-28 ENCOUNTER — Encounter (HOSPITAL_COMMUNITY): Payer: Self-pay | Admitting: Family Medicine

## 2019-03-28 ENCOUNTER — Ambulatory Visit (HOSPITAL_COMMUNITY)
Admission: EM | Admit: 2019-03-28 | Discharge: 2019-03-28 | Disposition: A | Discharge status: Home / Self Care | Payer: Medicare HMO | Attending: Family Medicine | Admitting: Family Medicine

## 2019-03-28 DIAGNOSIS — M7989 Other specified soft tissue disorders: Secondary | ICD-10-CM | POA: Diagnosis not present

## 2019-03-28 DIAGNOSIS — S60031A Contusion of right middle finger without damage to nail, initial encounter: Secondary | ICD-10-CM

## 2019-03-28 DIAGNOSIS — M79641 Pain in right hand: Secondary | ICD-10-CM | POA: Diagnosis not present

## 2019-03-28 DIAGNOSIS — S6991XA Unspecified injury of right wrist, hand and finger(s), initial encounter: Secondary | ICD-10-CM | POA: Diagnosis not present

## 2019-03-28 HISTORY — DX: Depression, unspecified: F32.A

## 2019-03-28 NOTE — ED Provider Notes (Signed)
[facility]    CSN: [id number] Arrival date & time: 03/28/19  1216      History   Chief Complaint Chief Complaint  Patient presents with  . Fall     HPI Kara Solomon is a 83 y.o. female.   This 83 year old woman is making her initial visit to [facility].  She seeks evaluation for injury suffered in a fall.  She was moving some books on a bookcase yesterday and lost her balance.  She threw her right hand out to catch herself and struck the dorsal aspect of her right hand on a counter.  She is having pain over the MCP joint of her middle finger.  She also struck the midsternum but this has not been as tender as the hand.  She finds that she cannot make a fist today because of pain.  She was able to sleep last night.     Past Medical History:  Diagnosis Date  . Borderline hyperglycemia   . Cataract   . Depression   . HTN (hypertension)     Patient Active Problem List   Diagnosis Date Noted  . Urinary frequency 09/16/2018  . Anxiety and depression 06/01/2018  . Dementia without behavioral disturbance (HCC) 03/10/2018  . Adjustment reaction with anxiety 12/08/2017  . Memory impairment 10/18/2017  . Essential hypertension 10/18/2017    Past Surgical History:  Procedure Laterality Date  . CATARACT EXTRACTION, BILATERAL    . TONSILLECTOMY      OB History   No obstetric history on file.      Home Medications    Prior to Admission medications   Medication Sig Start Date End Date Taking? Authorizing Provider  amLODipine (NORVASC) 5 MG tablet Take 1 tablet (5 mg total) by mouth daily. 11/15/18   Everrett Coombe, DO  Melatonin 5 MG TABS Take 5 mg by mouth at bedtime as needed.     [provider]  mirtazapine (REMERON) 7.5 MG tablet Take 1 tablet (7.5 mg total) by mouth at bedtime. 03/20/19   Everrett Coombe, DO  Multiple Vitamin (MULTIVITAMIN WITH MINERALS) TABS tablet Take 1 tablet by mouth daily.    [provider]     Family History Family History  Problem Relation Age of Onset  . Heart disease Sister   . Heart failure Mother        died in early 84s  . Stroke Father 65  . Dementia Maternal Grandmother   . Breast cancer Daughter     Social History Social History   Tobacco Use  . Smoking status: Never Smoker  . Smokeless tobacco: Never Used  Substance Use Topics  . Alcohol use: No    Alcohol/week: 0.0 standard drinks  . Drug use: No     Allergies   Penicillins   Review of Systems Review of Systems  Musculoskeletal: Positive for joint swelling.  All other systems reviewed and are negative.    Physical Exam Triage Vital Signs ED Triage Vitals  Enc Vitals Group     BP      Pulse      Resp      Temp      Temp src      SpO2      Weight      Height      Head Circumference      Peak Flow      Pain Score      Pain Loc      Pain  Edu?      Excl. in [place]?    No data found.  Updated Vital Signs BP (!) 145/67 (BP Location: Right Arm)   Pulse 82   Temp 97.8 F (36.6 C) (Oral)   Resp 16   SpO2 100%    Physical Exam Vitals signs and nursing note reviewed.  Constitutional:      General: She is not in acute distress.    Appearance: Normal appearance. She is normal weight. She is not ill-appearing or toxic-appearing.  HENT:     Head: Normocephalic.     Right Ear: External ear normal.     Left Ear: External ear normal.  Eyes:     Conjunctiva/sclera: Conjunctivae normal.  Neck:     Musculoskeletal: Normal range of motion and neck supple.  Cardiovascular:     Rate and Rhythm: Normal rate and regular rhythm.     Heart sounds: Normal heart sounds.     Comments: Normal-appearing sternum Pulmonary:     Effort: Pulmonary effort is normal.     Breath sounds: Normal breath sounds.  Musculoskeletal:        General: Swelling, tenderness and signs of injury present.     Comments: Tender, red, swollen MCP joint of middle finger on right hand dorsally  Patient cannot make a  fist or flex the MCP joint more than 20 or 30 degrees.  Skin:    General: Skin is warm and dry.  Neurological:     General: No focal deficit present.     Mental Status: She is alert and oriented to person, place, and time.  Psychiatric:        Mood and Affect: Mood normal.        Behavior: Behavior normal.        UC Treatments / Results  Labs (all labs ordered are listed, but only abnormal results are displayed) Labs Reviewed - No data to display  EKG   Radiology Dg Hand Complete Right  Result Date: 03/28/2019 CLINICAL DATA:  Hand injury.  Pain and swelling EXAM: RIGHT HAND - COMPLETE 3+ VIEW COMPARISON:  None. FINDINGS: There is no evidence of fracture or dislocation. There is no evidence of arthropathy or other focal bone abnormality. Soft tissues are unremarkable. IMPRESSION: Negative. Electronically Signed   By: Franchot Gallo M.D.   On: 03/28/2019 13:46    Procedures Procedures (including critical care time)  Medications Ordered in UC Medications - No data to display  Initial Impression / Assessment and Plan / UC Course  I have reviewed the triage vital signs and the nursing notes.  Pertinent labs & imaging results that were available during my care of the patient were reviewed by me and considered in my medical decision making (see chart for details).    Final Clinical Impressions(s) / UC Diagnoses   Final diagnoses:  Contusion of right middle finger without damage to nail, initial encounter     Discharge Instructions     EXAM: RIGHT HAND - COMPLETE 3+ VIEW   COMPARISON:  None.   FINDINGS: There is no evidence of fracture or dislocation. There is no evidence of arthropathy or other focal bone abnormality. Soft tissues are unremarkable.   IMPRESSION: Negative.     Electronically Signed   By: Franchot Gallo M.D.   On: 03/28/2019 13:46    ED Prescriptions    None     I have reviewed the PDMP during this encounter.   Robyn Haber, MD  03/28/19 1350

## 2019-03-28 NOTE — Discharge Instructions (Signed)
EXAM: RIGHT HAND - COMPLETE 3+ VIEW   COMPARISON:  None.   FINDINGS: There is no evidence of fracture or dislocation. There is no evidence of arthropathy or other focal bone abnormality. Soft tissues are unremarkable.   IMPRESSION: Negative.     Electronically Signed   By: Franchot Gallo M.D.   On: 03/28/2019 13:46

## 2019-03-28 NOTE — ED Triage Notes (Signed)
Pt presents with central chest pain and right hand pain & swelling after a fall yesterday; pt states she tripped and fell into her counter while trying to carry something.

## 2019-04-25 ENCOUNTER — Encounter: Payer: Self-pay | Admitting: Family Medicine

## 2019-05-01 ENCOUNTER — Telehealth (INDEPENDENT_AMBULATORY_CARE_PROVIDER_SITE_OTHER): Payer: Medicare HMO | Admitting: Family Medicine

## 2019-05-01 ENCOUNTER — Encounter: Payer: Self-pay | Admitting: Family Medicine

## 2019-05-01 DIAGNOSIS — F419 Anxiety disorder, unspecified: Secondary | ICD-10-CM | POA: Diagnosis not present

## 2019-05-01 DIAGNOSIS — I10 Essential (primary) hypertension: Secondary | ICD-10-CM

## 2019-05-01 DIAGNOSIS — F329 Major depressive disorder, single episode, unspecified: Secondary | ICD-10-CM

## 2019-05-01 DIAGNOSIS — R69 Illness, unspecified: Secondary | ICD-10-CM | POA: Diagnosis not present

## 2019-05-01 DIAGNOSIS — F32A Depression, unspecified: Secondary | ICD-10-CM

## 2019-05-01 MED ORDER — AMLODIPINE BESYLATE 5 MG PO TABS: 5.0000 mg | ORAL_TABLET | Freq: Every day | ORAL | 0 refills | Status: DC

## 2019-05-01 MED ORDER — MIRTAZAPINE 7.5 MG PO TABS: 7.5000 mg | ORAL_TABLET | Freq: Every day | ORAL | 3 refills | Status: DC

## 2019-05-01 NOTE — Assessment & Plan Note (Signed)
BP has been well controlled, continue amlodipine.

## 2019-05-01 NOTE — Assessment & Plan Note (Signed)
Insomnia and appetite improved with mirtazapine.  Some improvement in mood, will continue at current dose for now.  Follow up in 2 months to reassess.

## 2019-05-01 NOTE — Progress Notes (Signed)
Kara Solomon - 84 y.o. female MRN [id number]  Date of birth: 04-16-1936   This visit type was conducted due to national recommendations for restrictions regarding the COVID-19 Pandemic (e.g. social distancing).  This format is felt to be most appropriate for this patient at this time.  All issues noted in this document were discussed and addressed.  No physical exam was performed (except for noted visual exam findings with Video Visits).  I discussed the limitations of evaluation and management by telemedicine and the availability of in person appointments. The patient expressed understanding and agreed to proceed.  I connected with@ on 05/01/19 at  9:00 AM EST by a video enabled telemedicine application and verified that I am speaking with the correct person using two identifiers.  Present at visit: Kara Nutting, DO Kara Solomon   Patient Location: [place] [address]   Provider location:   Home office  Chief Complaint  Patient presents with  . Follow-up    Pt daughter recently passed and she's depressed about it.    HPI  Kara Solomon is a 84 y.o. female who presents via audio/video conferencing for a telehealth visit today.  She is following up today for depression with insomnia.  She was started on Mirtazapine around the first of December.  She reports that she is sleeping well since starting this.  She is no longer napping during the day. Mood has improved some but holidays were tough after recently losing her daughter.  She tries to get out to taker her mind off of things.  Appetite has improved some.  She denies side effects from medication.   ROS:  A comprehensive ROS was completed and negative except as noted per HPI  Past Medical History:  Diagnosis Date  . Borderline hyperglycemia   . Cataract   . Depression   . HTN (hypertension)     Past Surgical History:  Procedure Laterality Date  . CATARACT EXTRACTION, BILATERAL    . TONSILLECTOMY       Family History  Problem Relation Age of Onset  . Heart disease Sister   . Heart failure Mother        died in early 3s  . Stroke Father 78  . Dementia Maternal Grandmother   . Breast cancer Daughter     Social History   Socioeconomic History  . Marital status: Widowed    Spouse name: Not on file  . Number of children: 3  . Years of education: Not on file  . Highest education level: High school graduate  Occupational History  . Not on file  Tobacco Use  . Smoking status: Never Smoker  . Smokeless tobacco: Never Used  Substance and Sexual Activity  . Alcohol use: No    Alcohol/week: 0.0 standard drinks  . Drug use: No  . Sexual activity: Not on file  Other Topics Concern  . Not on file  Social History Narrative   Lives at home alone   Right handed   Caffeine: none   Social Determinants of Health   Financial Resource Strain:   . Difficulty of Paying Living Expenses: Not on file  Food Insecurity:   . Worried About Charity fundraiser in the Last Year: Not on file  . Ran Out of Food in the Last Year: Not on file  Transportation Needs:   . Lack of Transportation (Medical): Not on file  . Lack of Transportation (Non-Medical): Not on file  Physical Activity:   .  Days of Exercise per Week: Not on file  . Minutes of Exercise per Session: Not on file  Stress:   . Feeling of Stress : Not on file  Social Connections:   . Frequency of Communication with Friends and Family: Not on file  . Frequency of Social Gatherings with Friends and Family: Not on file  . Attends Religious Services: Not on file  . Active Member of Clubs or Organizations: Not on file  . Attends Banker Meetings: Not on file  . Marital Status: Not on file  Intimate Partner Violence:   . Fear of Current or Ex-Partner: Not on file  . Emotionally Abused: Not on file  . Physically Abused: Not on file  . Sexually Abused: Not on file     Current Outpatient Medications:  .  amLODipine  (NORVASC) 5 MG tablet, Take 1 tablet (5 mg total) by mouth daily., Disp: 90 tablet, Rfl: 0 .  Melatonin 5 MG TABS, Take 5 mg by mouth at bedtime as needed. , Disp: , Rfl:  .  mirtazapine (REMERON) 7.5 MG tablet, Take 1 tablet (7.5 mg total) by mouth at bedtime., Disp: 30 tablet, Rfl: 1 .  Multiple Vitamin (MULTIVITAMIN WITH MINERALS) TABS tablet, Take 1 tablet by mouth daily., Disp: , Rfl:   EXAM:  VITALS per patient if applicable: Ht 5\' 1"  (1.549 m)   BMI 24.11 kg/m   GENERAL: alert, oriented, appears well and in no acute distress  HEENT: atraumatic, conjunttiva clear, no obvious abnormalities on inspection of external nose and ears  NECK: normal movements of the head and neck  LUNGS: on inspection no signs of respiratory distress, breathing rate appears normal, no obvious gross SOB, gasping or wheezing  CV: no obvious cyanosis  MS: moves all visible extremities without noticeable abnormality  PSYCH/NEURO: pleasant and cooperative, no obvious depression or anxiety, speech and thought processing grossly intact  ASSESSMENT AND PLAN:  Discussed the following assessment and plan:  Essential hypertension BP has been well controlled, continue amlodipine.   Anxiety and depression Insomnia and appetite improved with mirtazapine.  Some improvement in mood, will continue at current dose for now.  Follow up in 2 months to reassess.        I discussed the assessment and treatment plan with the patient. The patient was provided an opportunity to ask questions and all were answered. The patient agreed with the plan and demonstrated an understanding of the instructions.   The patient was advised to call back or seek an in-person evaluation if the symptoms worsen or if the condition fails to improve as anticipated.    , DO

## 2019-06-07 ENCOUNTER — Telehealth: Payer: Self-pay

## 2019-06-07 NOTE — Telephone Encounter (Signed)
Lawson Fiscal called and reports her mom has increased anxiety. I have scheduled her for a virtual visit tomorrow.

## 2019-06-08 ENCOUNTER — Encounter: Payer: Self-pay | Admitting: Nurse Practitioner

## 2019-06-08 ENCOUNTER — Telehealth: Payer: Self-pay | Admitting: Nurse Practitioner

## 2019-06-08 ENCOUNTER — Telehealth (INDEPENDENT_AMBULATORY_CARE_PROVIDER_SITE_OTHER): Payer: Medicare HMO | Admitting: Nurse Practitioner

## 2019-06-08 ENCOUNTER — Telehealth: Payer: Medicare HMO | Admitting: Family Medicine

## 2019-06-08 DIAGNOSIS — F329 Major depressive disorder, single episode, unspecified: Secondary | ICD-10-CM | POA: Diagnosis not present

## 2019-06-08 DIAGNOSIS — F419 Anxiety disorder, unspecified: Secondary | ICD-10-CM | POA: Diagnosis not present

## 2019-06-08 DIAGNOSIS — F32A Depression, unspecified: Secondary | ICD-10-CM

## 2019-06-08 DIAGNOSIS — R69 Illness, unspecified: Secondary | ICD-10-CM | POA: Diagnosis not present

## 2019-06-08 MED ORDER — SERTRALINE HCL 25 MG PO TABS: 25.0000 mg | ORAL_TABLET | Freq: Every day | ORAL | 3 refills | Status: DC

## 2019-06-08 NOTE — Telephone Encounter (Signed)
Please schedule a follow-up appointment for this patient in approximately 2-3 weeks for anxiety/depression and new medication. She is a patient of Dr. Ashley Royalty, but I will be happy to see her if he cannot. Thank you!

## 2019-06-08 NOTE — Progress Notes (Signed)
Virtual Visit via MyChart Note  I connected with  Kara Solomon on 06/08/19 at  9:30 AM EST by the video enabled telemedicine application, MyChart, and verified that I am speaking with the correct person using two identifiers.   I introduced myself as a Designer, jewellery with the practice. We discussed the limitations of evaluation and management by telemedicine and the availability of in person appointments. The patient expressed understanding and agreed to proceed.  The patient is: at her daughters home I am: at home  Subjective:    CC: depression and anxiety  HPI: Kara Solomon is a pleasant 84 y.o. y/o female presenting via [place] today with her daughter for ongoing depression and anxiety symptoms.  Kara Solomon reports experiencing depressive symptoms after the loss of her husband several months ago.  Her symptoms increased with the additional loss of her daughter approximately 2 months ago.  She has been coping fairly well with the support of her daughters, however, continues to experience frequent sadness, tearfulness, and worry.  One of her daughters, who frequently stays with her, will no longer be able to do that and that has also caused an increase in her anxiety recently.   My colleague, Dr. Zigmund Daniel, started her on mirtazapine 7.5 mg at night in December, which she reports has significantly helped with her appetite, sleep, and symptoms throughout the night.  She reports she is still experiencing significant symptoms throughout the day and feels she needs additional support.  She has an excellent relationship with her daughters and they are tremendous support system for her.  She reports they are helping her get through these difficult times and she is able to talk openly with them about her feelings.  GAD 7 : Generalized Anxiety Score 06/08/2019  Nervous, Anxious, on Edge 3  Control/stop worrying 3  Worry too much - different things 3  Trouble relaxing 2  Restless 0  Easily  annoyed or irritable 0  Afraid - awful might happen 3  Total GAD 7 Score 14  Anxiety Difficulty Very difficult    Depression screen [facility] 2/9 06/08/2019 05/01/2019 10/18/2017  Decreased Interest 3 0 0  Down, Depressed, Hopeless 3 3 1   PHQ - 2 Score 6 3 1   Altered sleeping 0 0 -  Tired, decreased energy 0 1 -  Change in appetite 0 0 -  Feeling bad or failure about yourself  3 1 -  Trouble concentrating 0 1 -  Moving slowly or fidgety/restless 2 0 -  Suicidal thoughts 1 0 -  PHQ-9 Score 12 6 -  Difficult doing work/chores Somewhat difficult - -     Past medical history, Surgical history, Family history not pertinant except as noted below, Social history, Allergies, and medications have been entered into the medical record, reviewed, and corrections made.   Review of Systems:  General: No weight loss.   Neuro: No change in mental status  Mental Health: Endorses both depression, anxiety  Objective:    General: Speaking clearly in complete sentences without any shortness of breath.  Alert and oriented x3.  Normal judgment. No apparent acute distress. Patient appears sad  Impression and Recommendations:    1. Anxiety and depression Kara Solomon has been through significant loss and change in recent months and is experiencing anxiety and depression related to her grief.  She has responded well to the mirtazapine at night to help with sleep.   Will trial low-dose sertraline in the morning in addition to the low-dose mirtazapine at bedtime  daily.  I believe this combination will help reduce the chance of oversedation and hopefully help with the patient's mood during the day.   Discussed the option of grief counseling with the patient.  At this time, she feels her daughters are in excellent support and does not feel that this necessary.  We will follow up with the patient in approximately 2 weeks to determine the efficacy of the addition of sertraline.  Patient instructed to reach out for this  time if symptoms worsen or if she begins to experience negative side effects from the medication. - sertraline (ZOLOFT) 25 MG tablet; Take 1 tablet (25 mg total) by mouth daily.  Dispense: 30 tablet; Refill: 3    I discussed the assessment and treatment plan with the patient. The patient was provided an opportunity to ask questions and all were answered. The patient agreed with the plan and demonstrated an understanding of the instructions.   The patient was advised to call back or seek an in-person evaluation if the symptoms worsen or if the condition fails to improve as anticipated.  I provided 35 minutes of non-face-to-face interaction with this MYCHART visit.   Tollie Eth, NP

## 2019-06-08 NOTE — Patient Instructions (Addendum)
Managing Loss, Adult People experience loss in many different ways throughout their lives. Events such as moving, changing jobs, and losing friends can create a sense of loss. The loss may be as serious as a major health change, divorce, death of a pet, or death of a loved one. All of these types of loss are likely to create a physical and emotional reaction known as grief. Grief is the result of a major change or an absence of something or someone that you count on. Grief is a normal reaction to loss. A variety of factors can affect your grieving experience, including:  The nature of your loss.  Your relationship to what or whom you lost.  Your understanding of grief and how to manage it.  Your support system. How to manage lifestyle changes Keep to your normal routine as much as possible.  If you have trouble focusing or doing normal activities, it is acceptable to take some time away from your normal routine.  Spend time with friends and loved ones.  Eat a healthy diet, get plenty of sleep, and rest when you feel tired. How to recognize changes  The way that you deal with your grief will affect your ability to function as you normally do. When grieving, you may experience these changes:  Numbness, shock, sadness, anxiety, anger, denial, and guilt.  Thoughts about death.  Unexpected crying.  A physical sensation of emptiness in your stomach.  Problems sleeping and eating.  Tiredness (fatigue).  Loss of interest in normal activities.  Dreaming about or imagining seeing the person who died.  A need to remember what or whom you lost.  Difficulty thinking about anything other than your loss for a period of time.  Relief. If you have been expecting the loss for a while, you may feel a sense of relief when it happens. Follow these instructions at home:  Activity Express your feelings in healthy ways, such as:  Talking with others about your loss. It may be helpful to find  others who have had a similar loss, such as a support group.  Writing down your feelings in a journal.  Doing physical activities to release stress and emotional energy.  Doing creative activities like painting, sculpting, or playing or listening to music.  Practicing resilience. This is the ability to recover and adjust after facing challenges. Reading some resources that encourage resilience may help you to learn ways to practice those behaviors. General instructions  Be patient with yourself and others. Allow the grieving process to happen, and remember that grieving takes time. ? It is likely that you may never feel completely done with some grief. You may find a way to move on while still cherishing memories and feelings about your loss. ? Accepting your loss is a process. It can take months or longer to adjust.  Keep all follow-up visits as told by your health care provider. This is important. Where to find support To get support for managing loss:  Ask your health care provider for help and recommendations, such as grief counseling or therapy.  Think about joining a support group for people who are managing a loss. Where to find more information You can find more information about managing loss from:  American Society of Clinical Oncology: www.cancer.net  American Psychological Association: www.apa.org Contact a health care provider if:  Your grief is extreme and keeps getting worse.  You have ongoing grief that does not improve.  Your body shows symptoms of grief, such   as illness.  You feel depressed, anxious, or lonely. Get help right away if:  You have thoughts about hurting yourself or others. If you ever feel like you may hurt yourself or others, or have thoughts about taking your own life, get help right away. You can go to your nearest emergency department or call:  Your local emergency services (911 in the [place]  A suicide crisis helpline, such as the  National Suicide Prevention Lifeline at 1-800-273-8255. This is open 24 hours a day. Summary  Grief is the result of a major change or an absence of someone or something that you count on. Grief is a normal reaction to loss.  The depth of grief and the period of recovery depend on the type of loss and your ability to adjust to the change and process your feelings.  Processing grief requires patience and a willingness to accept your feelings and talk about your loss with people who are supportive.  It is important to find resources that work for you and to realize that people experience grief differently. There is not one grieving process that works for everyone in the same way.  Be aware that when grief becomes extreme, it can lead to more severe issues like isolation, depression, anxiety, or suicidal thoughts. Talk with your health care provider if you have any of these issues. This information is not intended to replace advice given to you by your health care provider. Make sure you discuss any questions you have with your health care provider. Document Revised: 06/10/2018 Document Reviewed: 08/20/2016 Elsevier Patient Education  2020 Elsevier Inc.  

## 2019-06-09 NOTE — Telephone Encounter (Signed)
Left a message for pt to call our office and schedule a 2-3 week f/u on anxiety/depression with Dr.Matthews or Minna Merritts

## 2019-08-02 ENCOUNTER — Ambulatory Visit (INDEPENDENT_AMBULATORY_CARE_PROVIDER_SITE_OTHER): Payer: Medicare HMO | Admitting: Family Medicine

## 2019-08-02 ENCOUNTER — Encounter: Payer: Self-pay | Admitting: Family Medicine

## 2019-08-02 ENCOUNTER — Other Ambulatory Visit: Payer: Self-pay

## 2019-08-02 DIAGNOSIS — F329 Major depressive disorder, single episode, unspecified: Secondary | ICD-10-CM

## 2019-08-02 DIAGNOSIS — R69 Illness, unspecified: Secondary | ICD-10-CM | POA: Diagnosis not present

## 2019-08-02 DIAGNOSIS — F32A Depression, unspecified: Secondary | ICD-10-CM

## 2019-08-02 DIAGNOSIS — F419 Anxiety disorder, unspecified: Secondary | ICD-10-CM | POA: Diagnosis not present

## 2019-08-02 MED ORDER — SERTRALINE HCL 50 MG PO TABS: 50.0000 mg | ORAL_TABLET | Freq: Every day | ORAL | 1 refills | Status: AC

## 2019-08-02 NOTE — Progress Notes (Signed)
Kara Solomon - 83 y.o. female MRN [id number]  Date of birth: [birth year]  Subjective Chief Complaint  Patient presents with  . Anxiety    HPI Kara Solomon is a 84 y.o. female here today for follow up of anxiety.  She is accompanied by her daughter today.  She reports was seen a few weeks ago by Emeterio Reeve for increased anxiety.  Sertraline started at previous visit.  She found this helpful initially but doesn't feel like it is as effective as it was when she initially started.  She is also taking mirtazapine at bedtime which has helped with her sleep. She recently spent a week at her daughters house.  When time to return to her home she felt sense of dread and didn't want to return.  She does get lonesome since her husband passed away.  Her daughter has offered to let her move in with her and she is considering this.  She feels much less anxious when she is at her daughters house.    ROS:  A comprehensive ROS was completed and negative except as noted per HPI  Allergies  Allergen Reactions  . Penicillins Hives    Has patient had a PCN reaction causing immediate rash, facial/tongue/throat swelling, SOB or lightheadedness with hypotension: Unknown Has patient had a PCN reaction causing severe rash involving mucus membranes or skin necrosis: Unknown Has patient had a PCN reaction that required hospitalization: Unknown Has patient had a PCN reaction occurring within the last 10 years: No If all of the above answers are "NO", then may proceed with Cephalosporin use.     Past Medical History:  Diagnosis Date  . Borderline hyperglycemia   . Cataract   . Depression   . HTN (hypertension)     Past Surgical History:  Procedure Laterality Date  . CATARACT EXTRACTION, BILATERAL    . TONSILLECTOMY      Social History   Socioeconomic History  . Marital status: Widowed    Spouse name: Not on file  . Number of children: 3  . Years of education: Not on file  . Highest education level: High  school graduate  Occupational History  . Not on file  Tobacco Use  . Smoking status: Never Smoker  . Smokeless tobacco: Never Used  Substance and Sexual Activity  . Alcohol use: No    Alcohol/week: 0.0 standard drinks  . Drug use: No  . Sexual activity: Not on file  Other Topics Concern  . Not on file  Social History Narrative   Lives at home alone   Right handed   Caffeine: none   Social Determinants of Health   Financial Resource Strain:   . Difficulty of Paying Living Expenses:   Food Insecurity:   . Worried About Charity fundraiser in the Last Year:   . Arboriculturist in the Last Year:   Transportation Needs:   . Film/video editor (Medical):   Marland Kitchen Lack of Transportation (Non-Medical):   Physical Activity:   . Days of Exercise per Week:   . Minutes of Exercise per Session:   Stress:   . Feeling of Stress :   Social Connections:   . Frequency of Communication with Friends and Family:   . Frequency of Social Gatherings with Friends and Family:   . Attends Religious Services:   . Active Member of Clubs or Organizations:   . Attends Archivist Meetings:   Marland Kitchen Marital Status:     Family History  Problem Relation Age of Onset  . Heart disease Sister   . Heart failure Mother        died in early 15s  . Stroke Father 65  . Dementia Maternal Grandmother   . Breast cancer Daughter     Health Maintenance  Topic Date Due  . TETANUS/TDAP  Never done  . DEXA SCAN  Never done  . INFLUENZA VACCINE  11/19/2019  . PNA vac Low Risk Adult (2 of 2 - PPSV23) 03/19/2020     ----------------------------------------------------------------------------------------------------------------------------------------------------------------------------------------------------------------- Physical Exam BP (!) 148/62   Pulse 75   Temp 98.2 F (36.8 C) (Oral)   Ht 5\' 1"  (1.549 m)   Wt 127 lb (57.6 kg)   BMI 24.00 kg/m   Physical Exam Constitutional:       Appearance: Normal appearance.  HENT:     Head: Normocephalic and atraumatic.  Skin:    General: Skin is warm.  Neurological:     General: No focal deficit present.     Mental Status: She is alert.  Psychiatric:        Mood and Affect: Mood normal.        Behavior: Behavior normal.     ------------------------------------------------------------------------------------------------------------------------------------------------------------------------------------------------------------------- Assessment and Plan  Anxiety and depression Sleep improved with mirtazapine, continue at current dose.  Will increased sertraline to 50mg  to help with better anxiety control during the daytime.  I discussed with her that I think I think it would be beneficial for her to move in with her daughter as she seems to be much less anxious and less lonesome.  She thinks she may want to do this and they will discuss further.   Return in about 8 weeks (around 09/27/2019) for Anxiety-Ok for virtual or in person.    Meds ordered this encounter  Medications  . sertraline (ZOLOFT) 50 MG tablet    Sig: Take 1 tablet (50 mg total) by mouth daily.    Dispense:  90 tablet    Refill:  1    Return in about 8 weeks (around 09/27/2019) for Anxiety-Ok for virtual or in person.    This visit occurred during the SARS-CoV-2 public health emergency.  Safety protocols were in place, including screening questions prior to the visit, additional usage of staff PPE, and extensive cleaning of exam room while observing appropriate contact time as indicated for disinfecting solutions.

## 2019-08-02 NOTE — Patient Instructions (Addendum)
Great to see you today! Increase sertraline to 50mg  daily.  Follow up with me in about 8 weeks.

## 2019-08-02 NOTE — Assessment & Plan Note (Signed)
Sleep improved with mirtazapine, continue at current dose.  Will increased sertraline to 50mg  to help with better anxiety control during the daytime.  I discussed with her that I think I think it would be beneficial for her to move in with her daughter as she seems to be much less anxious and less lonesome.  She thinks she may want to do this and they will discuss further.   Return in about 8 weeks (around 09/27/2019) for Anxiety-Ok for virtual or in person.

## 2019-08-13 ENCOUNTER — Other Ambulatory Visit: Payer: Self-pay | Admitting: Family Medicine

## 2019-08-14 NOTE — Telephone Encounter (Signed)
CM-Plz see refill req/thx dmf 

## 2019-08-15 ENCOUNTER — Encounter: Payer: Medicare HMO | Attending: Psychology | Admitting: Psychology

## 2019-08-15 ENCOUNTER — Other Ambulatory Visit: Payer: Self-pay

## 2019-08-15 DIAGNOSIS — F419 Anxiety disorder, unspecified: Secondary | ICD-10-CM | POA: Insufficient documentation

## 2019-08-15 DIAGNOSIS — R413 Other amnesia: Secondary | ICD-10-CM | POA: Diagnosis not present

## 2019-08-15 DIAGNOSIS — F329 Major depressive disorder, single episode, unspecified: Secondary | ICD-10-CM | POA: Insufficient documentation

## 2019-08-15 DIAGNOSIS — F32A Depression, unspecified: Secondary | ICD-10-CM

## 2019-08-15 DIAGNOSIS — R69 Illness, unspecified: Secondary | ICD-10-CM | POA: Diagnosis not present

## 2019-08-15 NOTE — Progress Notes (Signed)
08/15/2019:  Today's visit was an in person clinical interview conducted with the patient, her daughter and myself in my outpatient clinic office.  Kara Solomon is an 84 year old female who I previously conducted a neuropsychological evaluation.  That complete report can be found in her medical records dated 11/11/2018.  This is a 22-month follow-up repeat testing for direct comparisons to previous performance for further diagnostic considerations.  During the clinical interview today, the patient reports that she has had a number of stressors over the last 9 months that she has been coping with.  The patient reports that one of her daughters died from breast cancer in 04-26-2023 which was very stressful for her and her youngest daughter had been having trouble with employment and housing.  The youngest daughter situation is improved a lot but the patient spent a good deal of time with anxiety and worry around these issues.  The patient is still coping with the death of her husband 2 years ago to some degree.  The patient's daughter reports that she has noted a general increase in anxiety and some increase in depressive symptoms.  They both report that memory does not appear to have been significantly worsened over the past 9 months but there was a significant worsening around the time of her husband's death.  The patient is described as having difficulty remembering aspects of conversations and interactions and having a hard time following and remembering things that might happen when she is watching TV.  The patient's daughter reports there are times when the patient will say that "I remember" but the daughter is not always sure that she does remember.  There are occasional word finding issues noted but these are not significant or frequent.  There are times when the patient missed names objects or perform circumlocutions.  Not significant paraphasic errors.  The patient describes some mild issues with  geographic orientation when she is driving but she is driving very infrequently now and others are doing much of the driving.  We set the patient up for formal neuropsychological testing where we will repeat the same battery of measures we used initially which was the repeatable battery for the assessment of neuropsychological status.

## 2019-09-01 ENCOUNTER — Encounter: Payer: Medicare HMO | Attending: Psychology | Admitting: Psychology

## 2019-09-01 ENCOUNTER — Other Ambulatory Visit: Payer: Self-pay

## 2019-09-01 ENCOUNTER — Encounter: Payer: Self-pay | Admitting: Psychology

## 2019-09-01 DIAGNOSIS — F0390 Unspecified dementia without behavioral disturbance: Secondary | ICD-10-CM | POA: Diagnosis not present

## 2019-09-01 DIAGNOSIS — F329 Major depressive disorder, single episode, unspecified: Secondary | ICD-10-CM | POA: Insufficient documentation

## 2019-09-01 DIAGNOSIS — F419 Anxiety disorder, unspecified: Secondary | ICD-10-CM | POA: Diagnosis not present

## 2019-09-01 DIAGNOSIS — R69 Illness, unspecified: Secondary | ICD-10-CM | POA: Diagnosis not present

## 2019-09-01 DIAGNOSIS — R413 Other amnesia: Secondary | ICD-10-CM | POA: Insufficient documentation

## 2019-09-01 NOTE — Progress Notes (Signed)
The patient arrived on time to her 8:00 testing appointment and was accompanied by her daughter. The appointment lasted 120 minutes.   Behavioral Observations:  Appearance: Casually and appropriately dressed with good hygiene. Gait: Ambulated independently without assistance. Speech: Clear, normal rate, normal tone & volume. Thought process:  Organized and logical, somewhat impulsive. No unusual content. Mood/Affect: Depressed and anxious Interpersonal: Appropriate. Orientation: Oriented x 4 Effort/Motivation: Good   She did not appear to have difficulty seeing, hearing, or understanding test items/questions and did not require much additional prompting. She exhibited poor distress tolerance on questions she did not know or tasks that were more difficult. She was cooperative with all assigned tasks. She appeared tired after completing the RBANS and reported feeling fatigued.      Tests Administered: . Animal Naming  . [employer] . Clock Drawing Test . Repeatable Battery for the Assessment of Neuropsychological Status (RBANS-U Form A) . Trail Making Test (A & B) . Wechsler Memory Scale-4th Edition (Older Adult Battery) o Visual Reproduction I & II o Logical Memory I & II Results:  To be reported once scoring is complete. Will be compared to previous testing on 08/29/2018.

## 2019-09-05 ENCOUNTER — Other Ambulatory Visit: Payer: Self-pay

## 2019-09-05 ENCOUNTER — Encounter (HOSPITAL_BASED_OUTPATIENT_CLINIC_OR_DEPARTMENT_OTHER): Payer: Medicare HMO | Admitting: Psychology

## 2019-09-05 DIAGNOSIS — F419 Anxiety disorder, unspecified: Secondary | ICD-10-CM

## 2019-09-05 DIAGNOSIS — R413 Other amnesia: Secondary | ICD-10-CM | POA: Diagnosis not present

## 2019-09-05 DIAGNOSIS — F329 Major depressive disorder, single episode, unspecified: Secondary | ICD-10-CM | POA: Diagnosis not present

## 2019-09-05 DIAGNOSIS — G3184 Mild cognitive impairment, so stated: Secondary | ICD-10-CM | POA: Diagnosis not present

## 2019-09-05 DIAGNOSIS — R69 Illness, unspecified: Secondary | ICD-10-CM | POA: Diagnosis not present

## 2019-09-05 DIAGNOSIS — F32A Depression, unspecified: Secondary | ICD-10-CM

## 2019-09-06 ENCOUNTER — Encounter (HOSPITAL_BASED_OUTPATIENT_CLINIC_OR_DEPARTMENT_OTHER): Payer: Medicare HMO | Admitting: Psychology

## 2019-09-06 ENCOUNTER — Other Ambulatory Visit: Payer: Self-pay

## 2019-09-06 DIAGNOSIS — F329 Major depressive disorder, single episode, unspecified: Secondary | ICD-10-CM | POA: Diagnosis not present

## 2019-09-06 DIAGNOSIS — F419 Anxiety disorder, unspecified: Secondary | ICD-10-CM

## 2019-09-06 DIAGNOSIS — R69 Illness, unspecified: Secondary | ICD-10-CM | POA: Diagnosis not present

## 2019-09-06 DIAGNOSIS — F32A Depression, unspecified: Secondary | ICD-10-CM

## 2019-09-06 DIAGNOSIS — R413 Other amnesia: Secondary | ICD-10-CM | POA: Diagnosis not present

## 2019-09-06 DIAGNOSIS — G3184 Mild cognitive impairment, so stated: Secondary | ICD-10-CM | POA: Diagnosis not present

## 2019-09-07 ENCOUNTER — Encounter: Payer: Self-pay | Admitting: Psychology

## 2019-09-07 NOTE — Progress Notes (Signed)
Today I provided feedback to the patient and one of her daughters regarding the results of the recent neuropsychological evaluation compared with her baseline scores (08/29/18). Below you can find the summary and impressions/diagnoses from that formal evaluation.  The complete report can be found in the patient's medical records dated 09/05/2019 and the full history and initial intake information can be found dated 08/15/2019. She is scheduled to return in 9 months to assess any changes and re-evaluate learning and memory.   SUMMARY/IMPRESSIONS The patient was seen for repeat testing compared to 1 year ago to assess for progression vs stability of memory concerns in the context of anxiety, depression, and grief/bereavement. She was cooperative and participated in all tasks given to her and she passed embedded measures of validity and effort. Test results obtained are believed to be a largely adequate representation of her current cognitive functioning.   On a battery of neuropsychological tests, she showed average cognitive functioning overall. Performance was strong for processing speed and aspects of executive function and relatively intact for more basic attention, language and visuospatial abilities. She showed a relative weakness and borderline to moderately impaired range functioning in immediate and delayed memory, respectively. However, she was not amnestic and able to recognize both auditory and visually encoded stimuli with good accuracy. Behaviorally she showed mild impulsivity but no significant executive dysfunction.  She showed good insight into her weaknesses and judgment appeared intact.   Overall, consistent with previous testing (08/29/19), results from the current evaluation suggest decline in cognitive abilities compared to estimates of baseline functioning, with significant deficits in learning and memory (initial encoding and retrieval; both verbal and visual-spatial). However, when  directly compared, most scores declined to various degrees, with significant improvement in visual spatial skills and intact executive functioning found. Language was slightly variable but overall persevered. Her relatively preserved performance on recognition trials during memory testing is not generally consistent with performance associated with Alzheimer's disease, but is commonly associated more subcortical deficits (e.g., small vessel ischemic disease).  In summary, the current results likely reflect several etiological factors working in combination, including cerebrovascular risk factors, anxiety, and depression. Given her clinical presentation, findings from repeat neuropsychological testing, medical history, and information obtained from clinical interview (no decline in ADLs/IADLSs reported by either the patient or daughter), her neuropsychological profile meets criteria for Mild Cognitive Impairment with memory loss (G31.84), at this time.   Positive prognostic factors include preserved overall cognitive ability, active participation in leisure activities, family support, ambulate without assistance, and independently perform all daily activities.    RECOMMENDATIONS   Functional neuroimaging (FDG/PET) would help to clarify the current clinical picture and rule out any neurodegenerative disease process that may be at a pre-clinical stage.    She may benefit from an updated psychiatric consult to address anxiety and depression. She may also benefit from grief counseling and/or other forms of therapy to address associated mood symptoms.        The patient may benefit from help to increase participation in pleasurable activities and improve social relationships. The patient should continue to stay active socially, physically, and mentally active. Continuous activity and responsibility can provide a sense of purpose in life, facilitate maintenance of independence, and improve overall  quality of life. The tasks should take into consideration her cognitive/physical strengths and weaknesses.   Repeat neuropsychological testing in 9-12 months. We will continue to monitor the patient's neuropsychological status over time to aid in differential diagnosis along with management and treatment. Serial assessment  will aid in developing optimal strategies for symptom management and enhancing quality of life.

## 2019-09-07 NOTE — Progress Notes (Addendum)
Neuropsychological Evaluation   Patient:                       Kara Solomon      DOB:                           01/02/1936  MR Number:               [id number]   [facility]                    [facility] [address] [id number] [facility] [place] [id number] Dept: [phone]                                                                                                              Date of Service:                    09/05/2019  Start Time:                             8 AM  End Time:                               9 AM  Provider/Observer:               Arley PhenixJohn Rodenbough, Psy.D.                                                   Clinical Neuropsychologist                                                   Chief Complaint:                   Short-term memory and confusion issues and trouble with word finding and following conversations.  Reason for Service:              Kara Solomon is an 84 year old female who I previously conducted a neuropsychological evaluation.  That complete report can be found in her medical records dated 11/11/2018.  This is a 7137-month follow-up repeat testing for direct comparisons to previous performance for further diagnostic considerations.  During the clinical interview today, the patient reports that she has had a number of stressors over the last 9 months that she has been coping with.  The patient reports that one of her daughters died from breast cancer in December which was very stressful for her and her youngest daughter had been having trouble  with employment and housing.  The youngest daughter situation is improved a lot but the patient spent a good deal of time with anxiety and worry around these issues.  The patient is still coping with the death of her husband 2 years ago to some degree.  The patient's daughter reports that she has noted a  general increase in anxiety and some increase in depressive symptoms.  They both report that memory does not appear to have been significantly worsened over the past 9 months but there was a significant worsening around the time of her husband's death.  The patient is described as having difficulty remembering aspects of conversations and interactions and having a hard time following and remembering things that might happen when she is watching TV.  The patient's daughter reports there are times when the patient will say that "I remember" but the daughter is not always sure that she does remember.  There are occasional word finding issues noted but these are not significant or frequent.  There are times when the patient missed names objects or perform circumlocutions.  Not significant paraphasic errors.  The patient describes some mild issues with geographic orientation when she is driving but she is driving very infrequently now and others are doing much of the driving.  We set the patient up for formal neuropsychological testing where we will repeat the same battery of measures we used initially which was the repeatable battery for the assessment of neuropsychological status.  MENTAL STATUS & BEHAVIORAL OBSERVATIONS  Appearance: casually dressed with adequate hygiene.                      Gait: Ambulated independently without assistance. Slow pace.                                              Speech: Mostly clear, reduced rate, low tone & volume. Mild WFD.                                                          Thought process:  Linear, organized, and logical. Mild impulsivity & moderate inattention.  Mood/Affect: Depressed and anxious, flattened.                                                                          Interpersonal: Guarded and slightly suspicious, but polite and appropriate overall Orientation: Oriented x 4  Effort/Motivation: Good     The patient did not appear to have difficulty seeing, hearing, or understanding test instructions. She did display some inattention and did require additional prompting throughout the evaluation. She exhibited poor distress tolerance on questions she did not know or tasks that were more difficult. She appeared significantly tense and was observed clasping her hands together and interlocking her arms between her legs while sitting.   TEST RESULTS AND INTERPRETATION  VALIDITY: The validity of neuropsychological testing is limited by the extent to which the individual being tested may be assumed to have exerted adequate effort during testing. Behaviorally, the patient appeared motivated and she persisted well. She made a number of self-deprecating statements and was highly critical about her abilities during testing. In one instance, she said, "I know my memory is bad and I'm stressed out when my mind goes blank". Validity indicators embedded with clinical tests (Reliable Digit Span and recognition memory tests) were within normal limits. Overall, the results are likely a valid estimate of her current capabilities.  INTELLECTUAL ABILITY: Given her reported educational and occupational history, the patient's premorbid intellectual ability was estimated to be well average.   OVERALL COGNITIVE STATUS: The patient was given a neuropsychological screening battery (RBANS), which contains 12 subtests covering five neuropsychological domains. Her total scale score on that battery, a composite of performance across tests and estimate of global cognitive functioning, was 93, which was average for her age (32nd percentile). Performance on individual tests and domain scores are provided below. She was fully oriented to time, date, situation, place, and self.   Her RBANS Total scale score of 99 (47th percentile) on previous testing suggests a very slight decrease overall  but is mostly consistent with current testing.   ATTENTION/PROCESSING SPEED:  The patient's Attention Index score of 122 was superior for her age (93rd percentile) and a relative strength. Her performance on a test of auditory attention (RBANS Digit Span) was average (37th percentile) and she repeated up to 7 digits in order. A test of processing speed with visual scanning (RBANS Coding) was very superior (>99th percentile). Another test of processing speed with visual scanning and sequencing (Trails A) was also very superior for her age, gender, education, and ethnicity (98th percentile).   Compared to the results from her previous evaluation (RBANS Attention=135, 99th percentile), her performance on these tests represents a slight decrease in her basic attention abilities. Her ability to repeat back numbers in correct order immediately after presentation declined from the upper end of the average range (75th percentile in 2020) to the lower end of the average range (37th percentile in 2021). A test of processing speed with visual scanning (RBANS Coding) stayed relatively the same (>99th percentile)   LANGUAGE:  The tone, prosody, and fluency of the patient's speech were normal, with no clear errors or difficulty in speech and receptive language was intact. Her RBANS Language Index score of 92 was average for her age (30th percentile). Confrontation naming (RBANS Picture Naming) was average (26th to 50th percentile). She performed similarly on another measure of confrontation naming (Boston naming Test) and placed in the average range for her age, gender, education, and ethnicity (46th percentile). Category fluency (RBANS Semantic Fluency) was low average and relatively weak (9th percentile). She performed significantly better on another test of category fluency (Animal Naming) and placed in the superior range for her age, gender, education, and ethnicity (93rd percentile).  Compared to the results from her  previous neuropsychological evaluation, a significant  decrease was noted in her language abilities as measured by the RBANS, but she performed well on additional measures of naming and fluency.  Thus, the overall pattern of her language functioning across time (1 year) is slightly variable but generally stable and intact.   On previous testing, she performed in the high average range averaged across tasks of expressive language skills (RBANS Language Index=113, 81st percentile; 2020) whereas her current Language Index score was found to be average.   VISUOSPATIAL ABILITIES: Visuospatial ability was a relative strength. The patient's RBANS Visuospatial Index score of 121 was Superior for her age (92nd percentile). Copy of a geometric figure (RBANS Figure Copy) was superior (92nd percentile) and visuoperception (RBANS Line Orientation) was at least average (51st to 75th percentile). Overall results suggest a significant improvement in her visuospatial abilities compared to the results from previous testing. Her RBANS Visuospatial Index score of 92 on previous testing was average (30th percentile).   MEMORY:  The patient's RBANS Immediate Memory Index score of 73 was borderline impaired for her age (4th percentile). Immediate recall for a list of 10 words read to her 4 times (RBANS List Learning) was relatively weak and low average (9th percentile). Immediate recall for a story read 2 times (RBANS Story Memory) was moderately impaired (2nd percentile).   The patient's RBANS Delayed Memory Index score of 68 was moderately impaired for her age (2nd percentile). Spontaneous recall for the list of words read to her after delay (RBANS List Recall) was low average (1 word, 10th to 16th percentile) and yes/no recognition for the words (RBANS List Recognition) was also low average (17/20, 10th to 16th percentile). Of note, she admitted that she was not confident in any of her answers and felt that she was guessing.    Delayed recall for the story [facility] Story Recall) was moderately impaired (2nd percentile). Spontaneous recall for the copied figure (RBANS Figure Recall) scored low average (9th percentile). Overall, this performance is below expectation given average premorbid estimate of intelligence.   Compared to previous testing, a decrease was noted in both immediate and delayed memory with a larger decline in the latter. Her delayed memory index score dropped from the low average range (10th percentile) on 08/29/2018 to the moderately impaired range on current testing (09/01/2019).   EXECUTIVE FUNCTIONING:  As mentioned above, the patient's performance was average to superior for working memory and verbal fluency. Behaviorally, she was socially appropriate but did show some impulsivity.  Also mentioned above, Trails A speed was very superior. When a switching component was added (Trails B), the patient's performance was not impacted much and stayed superior (percentile). She did make one set-loss error.   EMOTIONAL FUNCTIONING: The patient endorsed feeling depressed and anxious most days for the last 2-3 years; this has been getting worse over time. She presented with low mood and appeared significantly tense throughout the evaluation (e.g., held hands w/ interlocked arms between legs much of the time).   SUMMARY/IMPRESSIONS The patient was seen for repeat testing compared to 1 year ago to assess for progression vs stability of memory concerns in the context of anxiety, depression, and grief/bereavement. She was cooperative and participated in all tasks given to her and she passed embedded measures of validity and effort. Test results obtained are believed to be a largely adequate representation of her current cognitive functioning.   On a battery of neuropsychological tests, she showed average cognitive functioning overall. Performance was strong for processing speed and aspects of executive function  and  relatively intact for more basic attention, language and visuospatial abilities. She showed a relative weakness and borderline to moderately impaired range functioning in immediate and delayed memory, respectively. However, she was not amnestic and able to recognize both auditory and visually encoded stimuli with good accuracy. Behaviorally she showed mild impulsivity but no significant executive dysfunction.  She showed good insight into her weaknesses and judgment appeared intact.   Overall, consistent with previous testing (08/29/19), results from the current evaluation suggest decline in cognitive abilities compared to estimates of baseline functioning, with significant deficits in learning and memory (initial encoding and retrieval; both verbal and visual-spatial). However, when directly compared, most scores declined to various degrees, with significant improvement in visual spatial skills and intact executive functioning found. Language was slightly variable but overall persevered. Her relatively preserved performance on recognition trials during memory testing is not generally consistent with performance associated with Alzheimer's disease, but is commonly associated more subcortical deficits (e.g., small vessel ischemic disease).  In summary, the current results likely reflect several etiological factors working in combination, including cerebrovascular risk factors, anxiety, and depression. Given her clinical presentation, findings from repeat neuropsychological testing, medical history, and information obtained from clinical interview (no decline in ADLs/IADLSs reported by either the patient or daughter), her neuropsychological profile meets criteria for Mild Cognitive Impairment with memory loss (G31.84), at this time.   Positive prognostic factors include preserved overall cognitive ability, active participation in leisure activities, family support, ambulate without assistance, and independently  perform all daily activities.    RECOMMENDATIONS   Functional neuroimaging (FDG/PET) would help to clarify the current clinical picture and rule out any neurodegenerative disease process that may be at a pre-clinical stage.    She may benefit from a psychiatric consult to treat anxiety and depression. She may also benefit from grief counseling and/or other forms of therapy to address associated mood symptoms.    The patient may benefit from help to increase participation in pleasurable activities and improve social relationships. The patient should continue to stay active socially, physically, and mentally active. Continuous activity and responsibility can provide a sense of purpose in life, facilitate maintenance of independence, and improve overall quality of life. The tasks should take into consideration her cognitive/physical strengths and weaknesses.   Repeat neuropsychological testing in 9-12 months. We will continue to monitor the patient's neuropsychological status over time to aid in differential diagnosis along with management and treatment.    ______________________     ______________________ Arley Phenix, PsyD     Thayer Headings, PsyD Neuropsychologist      Neuropsychologist   SCORE TABLE   Results of the RBANS-Update (Form A-2021; Form B-2020):  Measure Standard Score 2021 (2020)  Percentile 2021 (2020) Description 2021 Sig Change? (Y/N?)  Immediate Memory 73 (78) 4 (7) Borderline No  List Learning 6 (6) 9 (9) Low Average No  Story Memory 4 (6) 2 (9) Borderline No  Visuospatial/Constructional 121 (92) 92 (30) Superior Yes  Figure Copy 14 (5) 91 (5) Superior Yes  Line Orientation - 51-75 (>75) Average No  Language 92 (113) 30 (81) Average Yes  Picture Naming - 26-50 (>75) Average No  YesSemantic Fluency 6 (13) 9 (84) Low Average Yes  Attention 122 (135) 93 (99) Superior Yes  Digit Span 9 (12) 37 (75) Average Yes  Coding 18 (19) >99 (>99) Very Superior No   Delayed Memory 68 (81) 2 (10) Impaired Yes  List Recall - 10-16 (10-16) Low Average No  List Recognition - (  10-16) (17-25) Low Average No  Story Recall 4 (8) 2 (25) Borderline Yes  Figure Recall 6 (7) 9 (16) Low Average No  Total Score 93 (99) 32 (47) Average No         Raw Score  2021 Percentile 2021 Description 2021  Boston Naming Test   -  Total 48/60 46 Average  Animal Naming   -  Total  23 93 Superior  Trail Making Test   -  Part A 21" 98 Very Superior  Part B 56" 97 Superior

## 2019-09-11 ENCOUNTER — Other Ambulatory Visit: Payer: Self-pay

## 2019-09-11 MED ORDER — MIRTAZAPINE 7.5 MG PO TABS: 7.5000 mg | ORAL_TABLET | Freq: Every day | ORAL | 3 refills | Status: AC

## 2019-09-27 ENCOUNTER — Telehealth: Payer: Medicare HMO | Admitting: Family Medicine

## 2019-09-28 ENCOUNTER — Telehealth (INDEPENDENT_AMBULATORY_CARE_PROVIDER_SITE_OTHER): Payer: Medicare HMO | Admitting: Family Medicine

## 2019-09-28 ENCOUNTER — Encounter: Payer: Self-pay | Admitting: Family Medicine

## 2019-09-28 DIAGNOSIS — R413 Other amnesia: Secondary | ICD-10-CM

## 2019-09-28 DIAGNOSIS — R69 Illness, unspecified: Secondary | ICD-10-CM | POA: Diagnosis not present

## 2019-09-28 DIAGNOSIS — F330 Major depressive disorder, recurrent, mild: Secondary | ICD-10-CM

## 2019-09-28 DIAGNOSIS — F339 Major depressive disorder, recurrent, unspecified: Secondary | ICD-10-CM | POA: Insufficient documentation

## 2019-09-28 NOTE — Assessment & Plan Note (Signed)
Stable at this time 

## 2019-09-28 NOTE — Progress Notes (Signed)
Following up on medication increase.

## 2019-09-28 NOTE — Assessment & Plan Note (Signed)
Depression and anxiety symptoms have improved with sertraline.  Moving in with her daughter has been good as well.  She will plan to establish with new provider in [place] area and I wished her all the best and that I would be available if needed until she got established with new provider.

## 2019-09-28 NOTE — Progress Notes (Signed)
Kara Solomon - 84 y.o. female MRN [id number]  Date of birth: May 12, 1935   This visit type was conducted due to national recommendations for restrictions regarding the COVID-19 Pandemic (e.g. social distancing).  This format is felt to be most appropriate for this patient at this time.  All issues noted in this document were discussed and addressed.  No physical exam was performed (except for noted visual exam findings with Video Visits).  I discussed the limitations of evaluation and management by telemedicine and the availability of in person appointments. The patient expressed understanding and agreed to proceed.  I connected with@ on 09/28/19 at 10:30 AM EDT by a video enabled telemedicine application and verified that I am speaking with the correct person using two identifiers.  Present at visit: Luetta Nutting, DO Darlin Coco   Patient Location: Home [place]. [place] [address]   Provider location:   [facility]  Chief Complaint  Patient presents with  . Follow-up    HPI  Kara Solomon is a 84 y.o. female who presents via audio/video conferencing for a telehealth visit today.  She is following up today on on mild cognitive impairment with depression and anxiety.  She has moved in with her daughter recently and is doing well.  She feels like her anxiety has improved since making the move and that the increase in sertraline has been helpful.  No side effects fro medications and she seems to be sleeping well.  She is planning on staying with her daughter and will plan to establish with a new PCP in [place], [place].     ROS:  A comprehensive ROS was completed and negative except as noted per HPI  Past Medical History:  Diagnosis Date  . Borderline hyperglycemia   . Cataract   . Depression   . HTN (hypertension)     Past Surgical History:  Procedure Laterality Date  . CATARACT EXTRACTION, BILATERAL    . TONSILLECTOMY      Family History  Problem Relation Age of Onset   . Heart disease Sister   . Heart failure Mother        died in early 85s  . Stroke Father 9  . Dementia Maternal Grandmother   . Breast cancer Daughter     Social History   Socioeconomic History  . Marital status: Widowed    Spouse name: Not on file  . Number of children: 3  . Years of education: Not on file  . Highest education level: High school graduate  Occupational History  . Not on file  Tobacco Use  . Smoking status: Never Smoker  . Smokeless tobacco: Never Used  Vaping Use  . Vaping Use: Never used  Substance and Sexual Activity  . Alcohol use: No    Alcohol/week: 0.0 standard drinks  . Drug use: No  . Sexual activity: Not on file  Other Topics Concern  . Not on file  Social History Narrative   Lives at home alone   Right handed   Caffeine: none   Social Determinants of Health   Financial Resource Strain:   . Difficulty of Paying Living Expenses:   Food Insecurity:   . Worried About Charity fundraiser in the Last Year:   . Arboriculturist in the Last Year:   Transportation Needs:   . Film/video editor (Medical):   Marland Kitchen Lack of Transportation (Non-Medical):   Physical Activity:   . Days of Exercise per Week:   . Minutes  of Exercise per Session:   Stress:   . Feeling of Stress :   Social Connections:   . Frequency of Communication with Friends and Family:   . Frequency of Social Gatherings with Friends and Family:   . Attends Religious Services:   . Active Member of Clubs or Organizations:   . Attends Banker Meetings:   Marland Kitchen Marital Status:   Intimate Partner Violence:   . Fear of Current or Ex-Partner:   . Emotionally Abused:   Marland Kitchen Physically Abused:   . Sexually Abused:      Current Outpatient Medications:  .  amLODipine (NORVASC) 5 MG tablet, Take 1 tablet by mouth once daily, Disp: 90 tablet, Rfl: 0 .  Melatonin 5 MG TABS, Take 5 mg by mouth at bedtime as needed. , Disp: , Rfl:  .  mirtazapine (REMERON) 7.5 MG tablet, Take 1  tablet (7.5 mg total) by mouth at bedtime., Disp: 30 tablet, Rfl: 3 .  Multiple Vitamin (MULTIVITAMIN WITH MINERALS) TABS tablet, Take 1 tablet by mouth daily., Disp: , Rfl:  .  sertraline (ZOLOFT) 50 MG tablet, Take 1 tablet (50 mg total) by mouth daily., Disp: 90 tablet, Rfl: 1  EXAM:  VITALS per patient if applicable: Ht 5\' 1"  (1.549 m)   Wt 127 lb (57.6 kg)   BMI 24.00 kg/m   GENERAL: alert, oriented, appears well and in no acute distress  HEENT: atraumatic, conjunttiva clear, no obvious abnormalities on inspection of external nose and ears  NECK: normal movements of the head and neck  LUNGS: on inspection no signs of respiratory distress, breathing rate appears normal, no obvious gross SOB, gasping or wheezing  CV: no obvious cyanosis  MS: moves all visible extremities without noticeable abnormality  PSYCH/NEURO: pleasant and cooperative, no obvious depression or anxiety, speech and thought processing grossly intact  ASSESSMENT AND PLAN:  Discussed the following assessment and plan:  MDD (major depressive disorder), recurrent episode (HCC) Depression and anxiety symptoms have improved with sertraline.  Moving in with her daughter has been good as well.  She will plan to establish with new provider in [place] area and I wished her all the best and that I would be available if needed until she got established with new provider.   Memory impairment Stable at this time.       I discussed the assessment and treatment plan with the patient. The patient was provided an opportunity to ask questions and all were answered. The patient agreed with the plan and demonstrated an understanding of the instructions.   The patient was advised to call back or seek an in-person evaluation if the symptoms worsen or if the condition fails to improve as anticipated.    [place], DO

## 2019-10-05 DIAGNOSIS — M25551 Pain in right hip: Secondary | ICD-10-CM | POA: Diagnosis not present

## 2019-10-09 DIAGNOSIS — M13851 Other specified arthritis, right hip: Secondary | ICD-10-CM | POA: Diagnosis not present

## 2019-10-09 DIAGNOSIS — S3210XA Unspecified fracture of sacrum, initial encounter for closed fracture: Secondary | ICD-10-CM | POA: Diagnosis not present

## 2019-10-11 DIAGNOSIS — M25551 Pain in right hip: Secondary | ICD-10-CM | POA: Diagnosis not present

## 2019-10-11 DIAGNOSIS — S76319A Strain of muscle, fascia and tendon of the posterior muscle group at thigh level, unspecified thigh, initial encounter: Secondary | ICD-10-CM | POA: Diagnosis not present

## 2019-10-25 DIAGNOSIS — S76319A Strain of muscle, fascia and tendon of the posterior muscle group at thigh level, unspecified thigh, initial encounter: Secondary | ICD-10-CM | POA: Diagnosis not present

## 2019-10-25 DIAGNOSIS — M25551 Pain in right hip: Secondary | ICD-10-CM | POA: Diagnosis not present

## 2019-11-06 DIAGNOSIS — M25551 Pain in right hip: Secondary | ICD-10-CM | POA: Diagnosis not present

## 2019-11-06 DIAGNOSIS — S76311A Strain of muscle, fascia and tendon of the posterior muscle group at thigh level, right thigh, initial encounter: Secondary | ICD-10-CM | POA: Diagnosis not present

## 2019-11-14 ENCOUNTER — Other Ambulatory Visit: Payer: Self-pay

## 2019-11-14 MED ORDER — AMLODIPINE BESYLATE 5 MG PO TABS: 5.0000 mg | ORAL_TABLET | Freq: Every day | ORAL | 0 refills | Status: AC

## 2019-11-16 ENCOUNTER — Encounter: Payer: Self-pay | Admitting: Family Medicine

## 2019-11-22 DIAGNOSIS — S76311A Strain of muscle, fascia and tendon of the posterior muscle group at thigh level, right thigh, initial encounter: Secondary | ICD-10-CM | POA: Diagnosis not present

## 2019-12-18 DIAGNOSIS — I517 Cardiomegaly: Secondary | ICD-10-CM | POA: Diagnosis not present

## 2019-12-18 DIAGNOSIS — J9811 Atelectasis: Secondary | ICD-10-CM | POA: Diagnosis not present

## 2019-12-18 DIAGNOSIS — I272 Pulmonary hypertension, unspecified: Secondary | ICD-10-CM | POA: Diagnosis not present

## 2019-12-18 DIAGNOSIS — R69 Illness, unspecified: Secondary | ICD-10-CM | POA: Diagnosis not present

## 2019-12-18 DIAGNOSIS — J90 Pleural effusion, not elsewhere classified: Secondary | ICD-10-CM | POA: Diagnosis not present

## 2019-12-18 DIAGNOSIS — I700 Atherosclerosis of aorta: Secondary | ICD-10-CM | POA: Diagnosis not present

## 2019-12-18 DIAGNOSIS — I10 Essential (primary) hypertension: Secondary | ICD-10-CM | POA: Diagnosis not present

## 2019-12-18 DIAGNOSIS — R0609 Other forms of dyspnea: Secondary | ICD-10-CM | POA: Diagnosis not present

## 2019-12-18 DIAGNOSIS — I5032 Chronic diastolic (congestive) heart failure: Secondary | ICD-10-CM | POA: Diagnosis not present

## 2019-12-18 DIAGNOSIS — J986 Disorders of diaphragm: Secondary | ICD-10-CM | POA: Diagnosis not present

## 2019-12-26 DIAGNOSIS — I340 Nonrheumatic mitral (valve) insufficiency: Secondary | ICD-10-CM | POA: Diagnosis not present

## 2019-12-26 DIAGNOSIS — I071 Rheumatic tricuspid insufficiency: Secondary | ICD-10-CM | POA: Diagnosis not present

## 2019-12-26 DIAGNOSIS — R0609 Other forms of dyspnea: Secondary | ICD-10-CM | POA: Diagnosis not present

## 2019-12-27 DIAGNOSIS — I340 Nonrheumatic mitral (valve) insufficiency: Secondary | ICD-10-CM | POA: Diagnosis not present

## 2019-12-27 DIAGNOSIS — I272 Pulmonary hypertension, unspecified: Secondary | ICD-10-CM | POA: Diagnosis not present

## 2019-12-27 DIAGNOSIS — I371 Nonrheumatic pulmonary valve insufficiency: Secondary | ICD-10-CM | POA: Diagnosis not present

## 2019-12-27 DIAGNOSIS — I358 Other nonrheumatic aortic valve disorders: Secondary | ICD-10-CM | POA: Diagnosis not present

## 2019-12-27 DIAGNOSIS — I361 Nonrheumatic tricuspid (valve) insufficiency: Secondary | ICD-10-CM | POA: Diagnosis not present

## 2019-12-27 DIAGNOSIS — R0609 Other forms of dyspnea: Secondary | ICD-10-CM | POA: Diagnosis not present

## 2019-12-28 DIAGNOSIS — R0600 Dyspnea, unspecified: Secondary | ICD-10-CM | POA: Diagnosis not present

## 2019-12-28 DIAGNOSIS — I700 Atherosclerosis of aorta: Secondary | ICD-10-CM | POA: Diagnosis not present

## 2019-12-28 DIAGNOSIS — I2510 Atherosclerotic heart disease of native coronary artery without angina pectoris: Secondary | ICD-10-CM | POA: Diagnosis not present

## 2019-12-28 DIAGNOSIS — J479 Bronchiectasis, uncomplicated: Secondary | ICD-10-CM | POA: Diagnosis not present

## 2019-12-28 DIAGNOSIS — R0609 Other forms of dyspnea: Secondary | ICD-10-CM | POA: Diagnosis not present

## 2020-01-01 DIAGNOSIS — R309 Painful micturition, unspecified: Secondary | ICD-10-CM | POA: Diagnosis not present

## 2020-01-01 DIAGNOSIS — I10 Essential (primary) hypertension: Secondary | ICD-10-CM | POA: Diagnosis not present

## 2020-01-01 DIAGNOSIS — D72829 Elevated white blood cell count, unspecified: Secondary | ICD-10-CM | POA: Diagnosis not present

## 2020-01-01 DIAGNOSIS — R0609 Other forms of dyspnea: Secondary | ICD-10-CM | POA: Diagnosis not present

## 2020-01-01 DIAGNOSIS — H9193 Unspecified hearing loss, bilateral: Secondary | ICD-10-CM | POA: Diagnosis not present

## 2020-01-01 DIAGNOSIS — R69 Illness, unspecified: Secondary | ICD-10-CM | POA: Diagnosis not present

## 2020-01-01 DIAGNOSIS — I272 Pulmonary hypertension, unspecified: Secondary | ICD-10-CM | POA: Diagnosis not present

## 2020-01-01 DIAGNOSIS — I2510 Atherosclerotic heart disease of native coronary artery without angina pectoris: Secondary | ICD-10-CM | POA: Diagnosis not present

## 2020-01-01 DIAGNOSIS — R0989 Other specified symptoms and signs involving the circulatory and respiratory systems: Secondary | ICD-10-CM | POA: Diagnosis not present

## 2020-01-01 DIAGNOSIS — I5032 Chronic diastolic (congestive) heart failure: Secondary | ICD-10-CM | POA: Diagnosis not present

## 2020-01-12 DIAGNOSIS — Z0289 Encounter for other administrative examinations: Secondary | ICD-10-CM | POA: Diagnosis not present

## 2020-01-12 DIAGNOSIS — R69 Illness, unspecified: Secondary | ICD-10-CM | POA: Diagnosis not present

## 2020-01-12 DIAGNOSIS — D72829 Elevated white blood cell count, unspecified: Secondary | ICD-10-CM | POA: Diagnosis not present

## 2020-01-22 DIAGNOSIS — H903 Sensorineural hearing loss, bilateral: Secondary | ICD-10-CM | POA: Diagnosis not present

## 2020-01-22 DIAGNOSIS — H9313 Tinnitus, bilateral: Secondary | ICD-10-CM | POA: Diagnosis not present

## 2020-01-24 DIAGNOSIS — Z7689 Persons encountering health services in other specified circumstances: Secondary | ICD-10-CM | POA: Diagnosis not present

## 2020-01-24 DIAGNOSIS — I10 Essential (primary) hypertension: Secondary | ICD-10-CM | POA: Diagnosis not present

## 2020-01-24 DIAGNOSIS — R0609 Other forms of dyspnea: Secondary | ICD-10-CM | POA: Diagnosis not present

## 2020-02-01 DIAGNOSIS — R0609 Other forms of dyspnea: Secondary | ICD-10-CM | POA: Diagnosis not present

## 2020-02-13 DIAGNOSIS — R410 Disorientation, unspecified: Secondary | ICD-10-CM | POA: Diagnosis not present

## 2020-02-13 DIAGNOSIS — Z23 Encounter for immunization: Secondary | ICD-10-CM | POA: Diagnosis not present

## 2020-02-26 DIAGNOSIS — R0609 Other forms of dyspnea: Secondary | ICD-10-CM | POA: Diagnosis not present

## 2020-04-09 DIAGNOSIS — R0602 Shortness of breath: Secondary | ICD-10-CM | POA: Diagnosis not present

## 2020-04-09 DIAGNOSIS — J479 Bronchiectasis, uncomplicated: Secondary | ICD-10-CM | POA: Diagnosis not present

## 2020-04-09 DIAGNOSIS — R0989 Other specified symptoms and signs involving the circulatory and respiratory systems: Secondary | ICD-10-CM | POA: Diagnosis not present

## 2020-04-17 DIAGNOSIS — J84112 Idiopathic pulmonary fibrosis: Secondary | ICD-10-CM | POA: Diagnosis not present

## 2020-04-17 DIAGNOSIS — R0602 Shortness of breath: Secondary | ICD-10-CM | POA: Diagnosis not present

## 2020-05-01 DIAGNOSIS — J8410 Pulmonary fibrosis, unspecified: Secondary | ICD-10-CM | POA: Diagnosis not present

## 2020-05-01 DIAGNOSIS — I272 Pulmonary hypertension, unspecified: Secondary | ICD-10-CM | POA: Diagnosis not present

## 2020-05-02 DIAGNOSIS — R030 Elevated blood-pressure reading, without diagnosis of hypertension: Secondary | ICD-10-CM | POA: Diagnosis not present

## 2020-05-02 DIAGNOSIS — L308 Other specified dermatitis: Secondary | ICD-10-CM | POA: Diagnosis not present

## 2020-05-08 DIAGNOSIS — G4733 Obstructive sleep apnea (adult) (pediatric): Secondary | ICD-10-CM | POA: Diagnosis not present

## 2020-05-17 DIAGNOSIS — J8410 Pulmonary fibrosis, unspecified: Secondary | ICD-10-CM | POA: Diagnosis not present

## 2020-05-17 DIAGNOSIS — G4733 Obstructive sleep apnea (adult) (pediatric): Secondary | ICD-10-CM | POA: Diagnosis not present

## 2020-05-17 DIAGNOSIS — I272 Pulmonary hypertension, unspecified: Secondary | ICD-10-CM | POA: Diagnosis not present

## 2020-05-17 DIAGNOSIS — R768 Other specified abnormal immunological findings in serum: Secondary | ICD-10-CM | POA: Diagnosis not present

## 2020-06-04 ENCOUNTER — Ambulatory Visit: Payer: Medicare HMO | Admitting: Psychology

## 2020-06-14 DIAGNOSIS — J8410 Pulmonary fibrosis, unspecified: Secondary | ICD-10-CM | POA: Diagnosis not present

## 2020-06-14 DIAGNOSIS — E538 Deficiency of other specified B group vitamins: Secondary | ICD-10-CM | POA: Diagnosis not present

## 2020-06-14 DIAGNOSIS — F411 Generalized anxiety disorder: Secondary | ICD-10-CM | POA: Diagnosis not present

## 2020-06-14 DIAGNOSIS — R69 Illness, unspecified: Secondary | ICD-10-CM | POA: Diagnosis not present

## 2020-06-14 DIAGNOSIS — Z79899 Other long term (current) drug therapy: Secondary | ICD-10-CM | POA: Diagnosis not present

## 2020-06-14 DIAGNOSIS — I10 Essential (primary) hypertension: Secondary | ICD-10-CM | POA: Diagnosis not present

## 2020-06-14 DIAGNOSIS — F331 Major depressive disorder, recurrent, moderate: Secondary | ICD-10-CM | POA: Diagnosis not present

## 2020-06-14 DIAGNOSIS — R413 Other amnesia: Secondary | ICD-10-CM | POA: Diagnosis not present

## 2020-06-14 DIAGNOSIS — G4733 Obstructive sleep apnea (adult) (pediatric): Secondary | ICD-10-CM | POA: Diagnosis not present

## 2020-07-04 DIAGNOSIS — J8410 Pulmonary fibrosis, unspecified: Secondary | ICD-10-CM | POA: Diagnosis not present

## 2020-07-04 DIAGNOSIS — I10 Essential (primary) hypertension: Secondary | ICD-10-CM | POA: Diagnosis not present

## 2020-07-04 DIAGNOSIS — I272 Pulmonary hypertension, unspecified: Secondary | ICD-10-CM | POA: Diagnosis not present

## 2020-07-04 DIAGNOSIS — G4733 Obstructive sleep apnea (adult) (pediatric): Secondary | ICD-10-CM | POA: Diagnosis not present

## 2020-07-16 DIAGNOSIS — I272 Pulmonary hypertension, unspecified: Secondary | ICD-10-CM | POA: Diagnosis not present

## 2020-07-16 DIAGNOSIS — R931 Abnormal findings on diagnostic imaging of heart and coronary circulation: Secondary | ICD-10-CM | POA: Diagnosis not present

## 2020-07-22 IMAGING — MR MR HEAD W/O CM
10 series · 42 of 48 positions shown · non-contrast
Comparison: CT 01/18/2018

CLINICAL DATA: Syncopal episode. Assess for possible stroke.
Evaluated 3 days ago for dizziness.

EXAM:
MRI HEAD WITHOUT CONTRAST
TECHNIQUE: Multiplanar, multiecho pulse sequences of the brain and surrounding
structures were obtained without intravenous contrast.

[Series 3: T1 · sagittal · 5.0mm · 0.47mm/px · 4 of 24 slices shown]
[im 1/24]
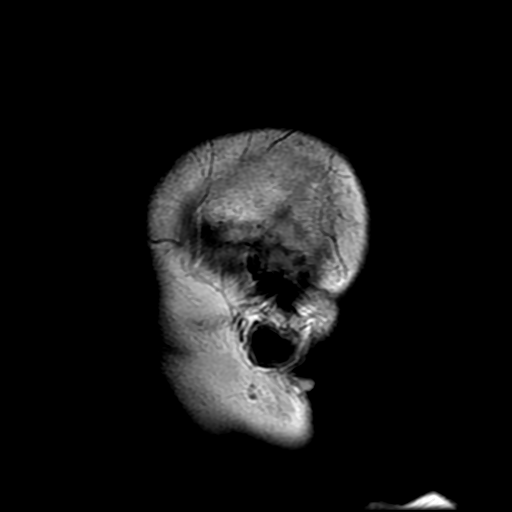
[im 8/24]
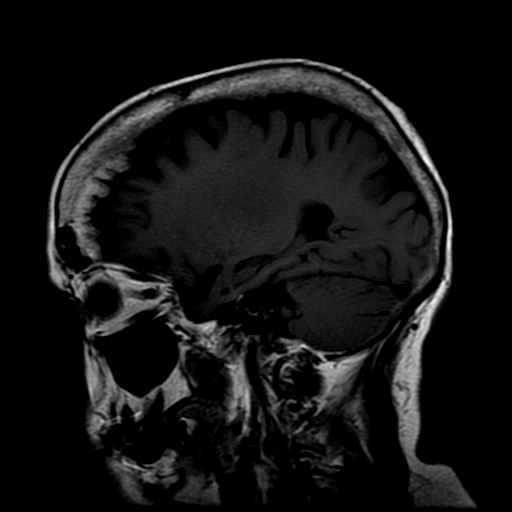
[im 16/24]
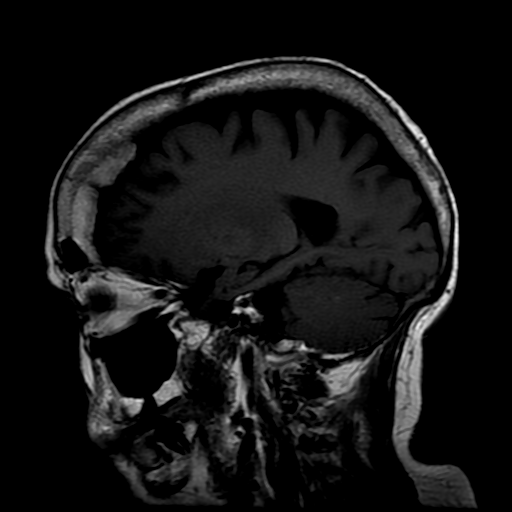
[im 24/24]
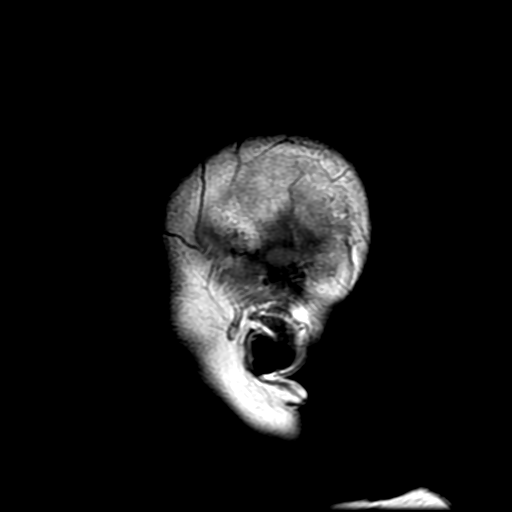

[Series 4: DWI · axial · 3.0mm · 1.09mm/px · z∈[-32,+103]mm · 8 of 92 slices shown (1 of 4)]
[im 1/92]
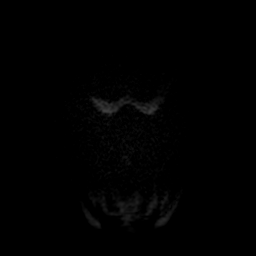
[im 11/92]
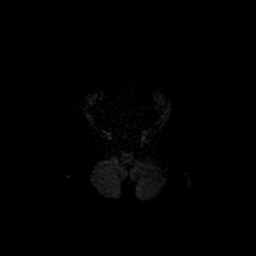
[im 31/92]
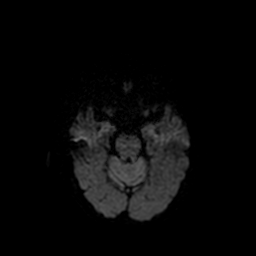
[im 41/92]
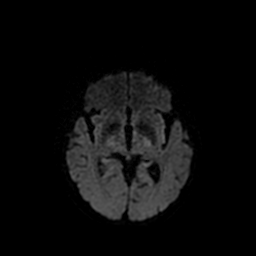
[im 51/92]
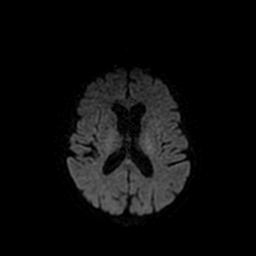
[im 61/92]
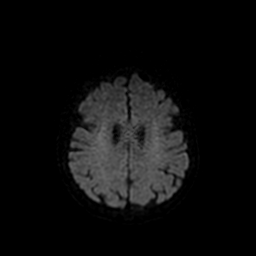
[im 81/92]
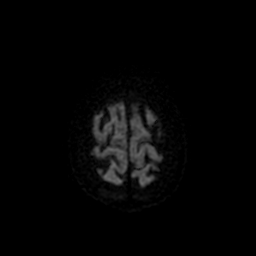
[im 92/92]
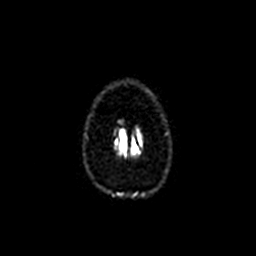

[Series 5: T2 · axial · 5.0mm · 0.86mm/px · z∈[-33,+107]mm · 2 of 21 slices shown (1 of 2)]
[im 1/21]
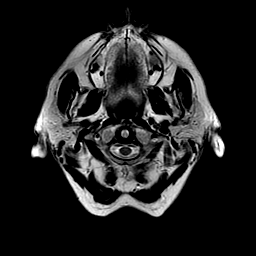
[im 21/21]
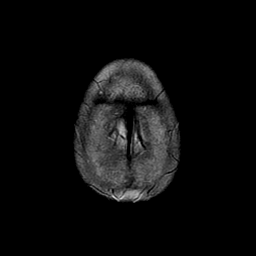

[Series 6: DWI · coronal · 4.0mm · 1.09mm/px · 9 of 82 slices shown (2 of 4)]
[im 1/82]
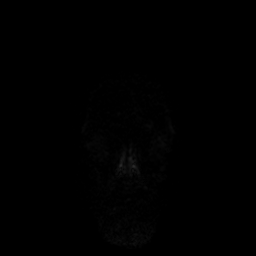
[im 11/82]
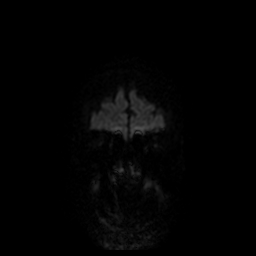
[im 21/82]
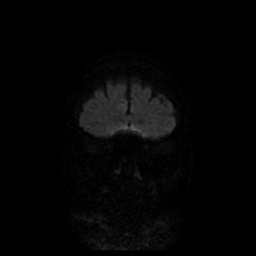
[im 31/82]
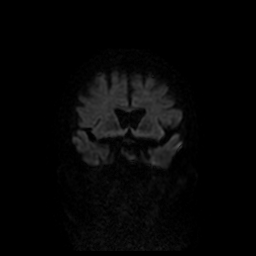
[im 41/82]
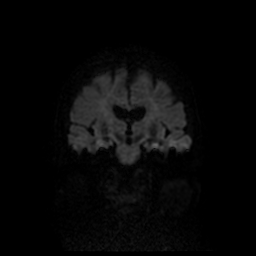
[im 51/82]
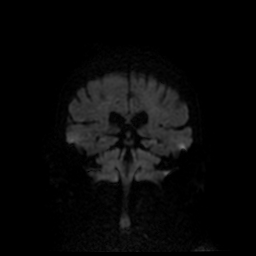
[im 61/82]
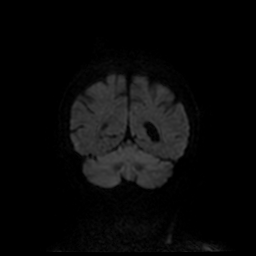
[im 71/82]
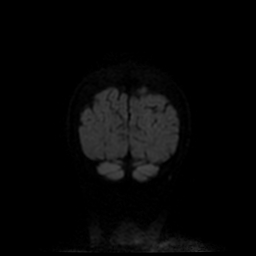
[im 82/82]
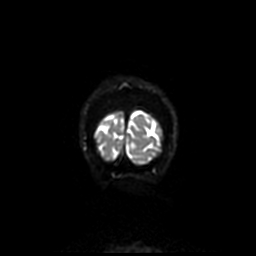

[Series 7: FLAIR · axial · 5.0mm · 0.43mm/px · z∈[-33,+107]mm · 2 of 21 slices shown]
[im 1/21]
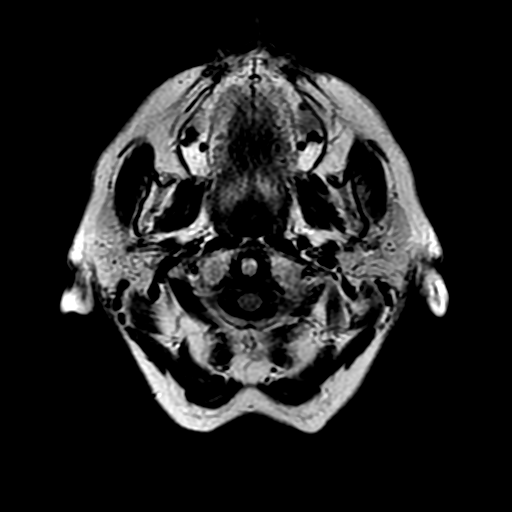
[im 21/21]
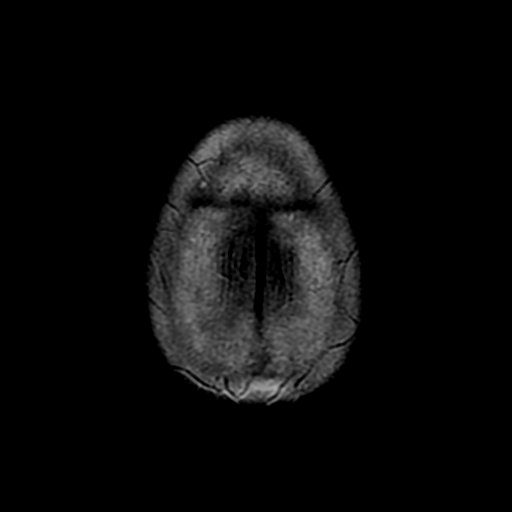

[Series 8: ax mpgr · axial · 5.0mm · 0.43mm/px · z∈[-33,+107]mm · 2 of 21 slices shown]
[im 1/21]
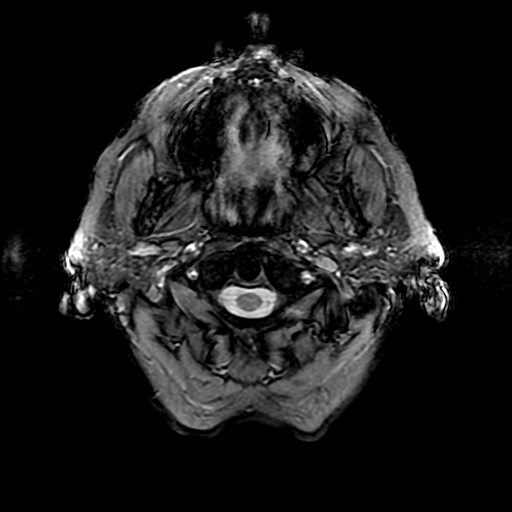
[im 21/21]
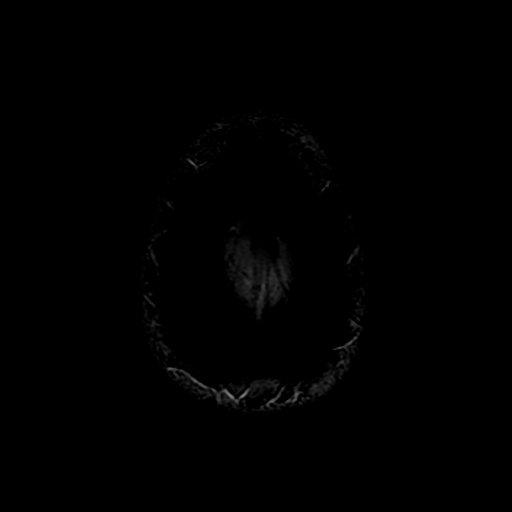

[Series 9: ax fspgr irp · axial · 3.0mm · 0.47mm/px · z∈[-37,-10]mm · 2 of 50 slices shown]
[im 1/50]
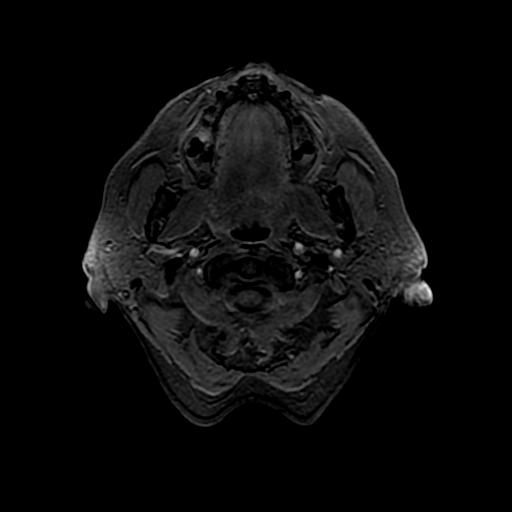
[im 10/50]
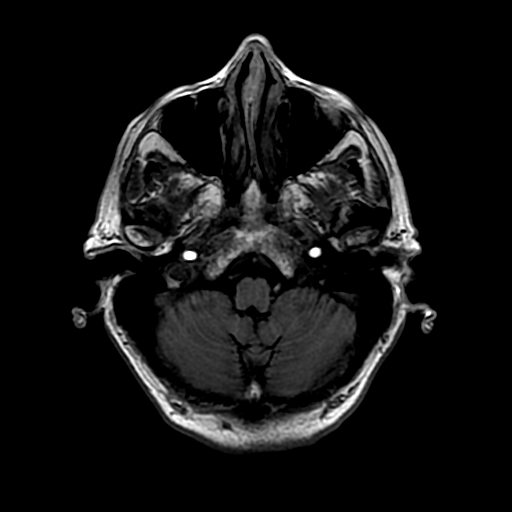

[Series 10: T2 · coronal · 5.0mm · 0.90mm/px · 3 of 26 slices shown (2 of 2)]
[im 1/26]
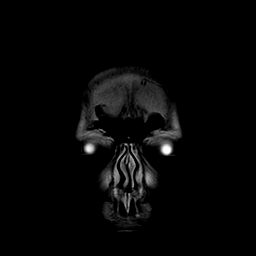
[im 13/26]
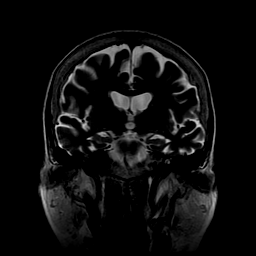
[im 26/26]
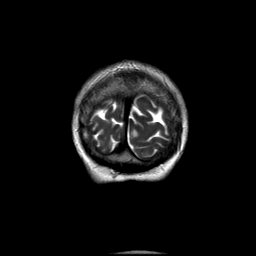

[Series 400: DWI · axial · 3.0mm · 1.09mm/px · z∈[-32,+103]mm · 5 of 46 slices shown (3 of 4)]
[im 1/46]
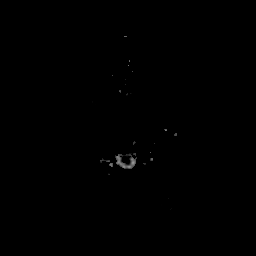
[im 12/46]
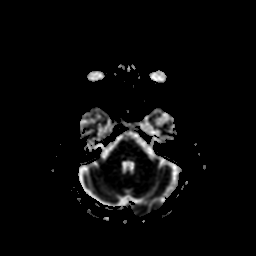
[im 23/46]
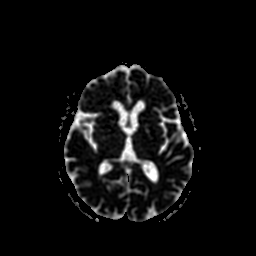
[im 34/46]
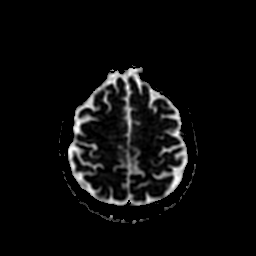
[im 46/46]
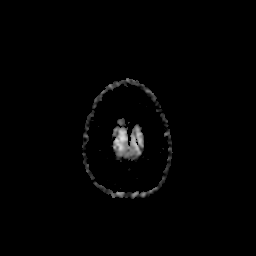

[Series 600: DWI · coronal · 4.0mm · 1.09mm/px · 5 of 41 slices shown (4 of 4)]
[im 1/41]
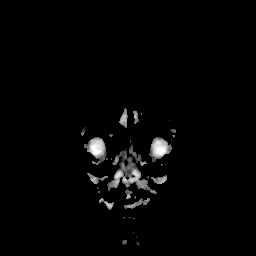
[im 11/41]
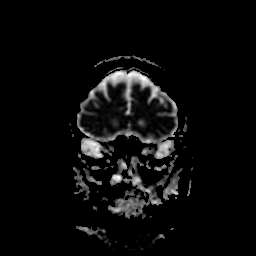
[im 21/41]
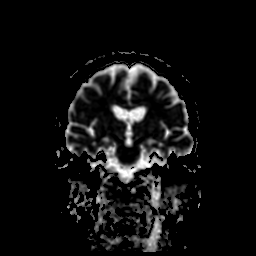
[im 31/41]
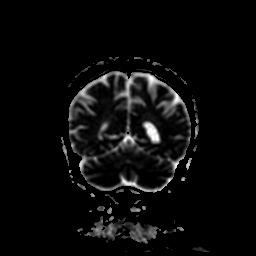
[im 41/41]
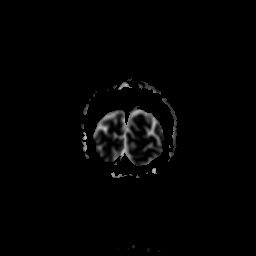

[42 of 48 positions shown; findings below may reference images not displayed]

FINDINGS: Brain: Diffusion imaging does not show any acute or subacute
infarction. The brainstem and cerebellum are normal. Cerebral
hemispheres are normal for age. No evidence of accelerated atrophy.
No old or acute large vessel insult. No mass lesion, hemorrhage,
hydrocephalus or extra-axial collection.

Vascular: Major vessels at the base of the brain show flow.

Skull and upper cervical spine: Negative

Sinuses/Orbits: Clear/normal

Other: None
IMPRESSION: Normal examination for age. No cause of the presenting symptoms is
identified.

## 2020-07-24 DIAGNOSIS — G4733 Obstructive sleep apnea (adult) (pediatric): Secondary | ICD-10-CM | POA: Diagnosis not present

## 2020-08-23 DIAGNOSIS — G4733 Obstructive sleep apnea (adult) (pediatric): Secondary | ICD-10-CM | POA: Diagnosis not present

## 2020-08-27 DIAGNOSIS — D869 Sarcoidosis, unspecified: Secondary | ICD-10-CM | POA: Diagnosis not present

## 2020-09-02 DIAGNOSIS — K759 Inflammatory liver disease, unspecified: Secondary | ICD-10-CM | POA: Diagnosis not present

## 2020-09-02 DIAGNOSIS — K219 Gastro-esophageal reflux disease without esophagitis: Secondary | ICD-10-CM | POA: Diagnosis not present

## 2020-09-02 DIAGNOSIS — I10 Essential (primary) hypertension: Secondary | ICD-10-CM | POA: Diagnosis not present

## 2020-09-02 DIAGNOSIS — E119 Type 2 diabetes mellitus without complications: Secondary | ICD-10-CM | POA: Diagnosis not present

## 2020-09-02 DIAGNOSIS — I509 Heart failure, unspecified: Secondary | ICD-10-CM | POA: Diagnosis not present

## 2020-09-02 DIAGNOSIS — R69 Illness, unspecified: Secondary | ICD-10-CM | POA: Diagnosis not present

## 2020-09-02 DIAGNOSIS — J449 Chronic obstructive pulmonary disease, unspecified: Secondary | ICD-10-CM | POA: Diagnosis not present

## 2020-09-02 DIAGNOSIS — R102 Pelvic and perineal pain: Secondary | ICD-10-CM | POA: Diagnosis not present

## 2020-09-02 DIAGNOSIS — M25551 Pain in right hip: Secondary | ICD-10-CM | POA: Diagnosis not present

## 2020-09-02 DIAGNOSIS — I110 Hypertensive heart disease with heart failure: Secondary | ICD-10-CM | POA: Diagnosis not present

## 2020-09-03 DIAGNOSIS — Z880 Allergy status to penicillin: Secondary | ICD-10-CM | POA: Diagnosis not present

## 2020-09-03 DIAGNOSIS — L03317 Cellulitis of buttock: Secondary | ICD-10-CM | POA: Diagnosis not present

## 2020-09-03 DIAGNOSIS — M25551 Pain in right hip: Secondary | ICD-10-CM | POA: Diagnosis not present

## 2020-09-03 DIAGNOSIS — I10 Essential (primary) hypertension: Secondary | ICD-10-CM | POA: Diagnosis not present

## 2020-09-03 DIAGNOSIS — L89319 Pressure ulcer of right buttock, unspecified stage: Secondary | ICD-10-CM | POA: Diagnosis not present

## 2020-09-03 DIAGNOSIS — L03312 Cellulitis of back [any part except buttock]: Secondary | ICD-10-CM | POA: Diagnosis not present

## 2020-09-03 DIAGNOSIS — J8410 Pulmonary fibrosis, unspecified: Secondary | ICD-10-CM | POA: Diagnosis not present

## 2020-09-05 DIAGNOSIS — S76011A Strain of muscle, fascia and tendon of right hip, initial encounter: Secondary | ICD-10-CM | POA: Diagnosis not present

## 2020-09-06 DIAGNOSIS — M25551 Pain in right hip: Secondary | ICD-10-CM | POA: Diagnosis not present

## 2020-09-06 DIAGNOSIS — I10 Essential (primary) hypertension: Secondary | ICD-10-CM | POA: Diagnosis not present

## 2020-09-06 DIAGNOSIS — L89311 Pressure ulcer of right buttock, stage 1: Secondary | ICD-10-CM | POA: Diagnosis not present

## 2020-09-23 DIAGNOSIS — G4733 Obstructive sleep apnea (adult) (pediatric): Secondary | ICD-10-CM | POA: Diagnosis not present

## 2020-10-04 DIAGNOSIS — I272 Pulmonary hypertension, unspecified: Secondary | ICD-10-CM | POA: Diagnosis not present

## 2020-10-04 DIAGNOSIS — J8410 Pulmonary fibrosis, unspecified: Secondary | ICD-10-CM | POA: Diagnosis not present

## 2020-10-04 DIAGNOSIS — G4733 Obstructive sleep apnea (adult) (pediatric): Secondary | ICD-10-CM | POA: Diagnosis not present

## 2020-10-04 DIAGNOSIS — J479 Bronchiectasis, uncomplicated: Secondary | ICD-10-CM | POA: Diagnosis not present

## 2020-10-04 DIAGNOSIS — I10 Essential (primary) hypertension: Secondary | ICD-10-CM | POA: Diagnosis not present

## 2020-10-07 DIAGNOSIS — I10 Essential (primary) hypertension: Secondary | ICD-10-CM | POA: Diagnosis not present

## 2020-10-07 DIAGNOSIS — L89311 Pressure ulcer of right buttock, stage 1: Secondary | ICD-10-CM | POA: Diagnosis not present

## 2020-10-22 DIAGNOSIS — G4733 Obstructive sleep apnea (adult) (pediatric): Secondary | ICD-10-CM | POA: Diagnosis not present

## 2020-10-23 DIAGNOSIS — G4733 Obstructive sleep apnea (adult) (pediatric): Secondary | ICD-10-CM | POA: Diagnosis not present

## 2020-11-23 DIAGNOSIS — G4733 Obstructive sleep apnea (adult) (pediatric): Secondary | ICD-10-CM | POA: Diagnosis not present

## 2020-11-27 DIAGNOSIS — G4733 Obstructive sleep apnea (adult) (pediatric): Secondary | ICD-10-CM | POA: Diagnosis not present

## 2020-12-07 DIAGNOSIS — G319 Degenerative disease of nervous system, unspecified: Secondary | ICD-10-CM | POA: Diagnosis not present

## 2020-12-07 DIAGNOSIS — M7989 Other specified soft tissue disorders: Secondary | ICD-10-CM | POA: Diagnosis not present

## 2020-12-07 DIAGNOSIS — M25532 Pain in left wrist: Secondary | ICD-10-CM | POA: Diagnosis not present

## 2020-12-07 DIAGNOSIS — M5032 Other cervical disc degeneration, mid-cervical region, unspecified level: Secondary | ICD-10-CM | POA: Diagnosis not present

## 2020-12-07 DIAGNOSIS — W19XXXA Unspecified fall, initial encounter: Secondary | ICD-10-CM | POA: Diagnosis not present

## 2020-12-07 DIAGNOSIS — S52352A Displaced comminuted fracture of shaft of radius, left arm, initial encounter for closed fracture: Secondary | ICD-10-CM | POA: Diagnosis not present

## 2020-12-07 DIAGNOSIS — S52615A Nondisplaced fracture of left ulna styloid process, initial encounter for closed fracture: Secondary | ICD-10-CM | POA: Diagnosis not present

## 2020-12-07 DIAGNOSIS — R519 Headache, unspecified: Secondary | ICD-10-CM | POA: Diagnosis not present

## 2020-12-07 DIAGNOSIS — I6523 Occlusion and stenosis of bilateral carotid arteries: Secondary | ICD-10-CM | POA: Diagnosis not present

## 2020-12-07 DIAGNOSIS — Y998 Other external cause status: Secondary | ICD-10-CM | POA: Diagnosis not present

## 2020-12-07 DIAGNOSIS — M4312 Spondylolisthesis, cervical region: Secondary | ICD-10-CM | POA: Diagnosis not present

## 2020-12-07 DIAGNOSIS — I639 Cerebral infarction, unspecified: Secondary | ICD-10-CM | POA: Diagnosis not present

## 2020-12-09 DIAGNOSIS — R2232 Localized swelling, mass and lump, left upper limb: Secondary | ICD-10-CM | POA: Diagnosis not present

## 2020-12-09 DIAGNOSIS — R2233 Localized swelling, mass and lump, upper limb, bilateral: Secondary | ICD-10-CM | POA: Diagnosis not present

## 2020-12-12 DIAGNOSIS — S52502A Unspecified fracture of the lower end of left radius, initial encounter for closed fracture: Secondary | ICD-10-CM | POA: Diagnosis not present

## 2020-12-12 DIAGNOSIS — I10 Essential (primary) hypertension: Secondary | ICD-10-CM | POA: Diagnosis not present

## 2020-12-12 DIAGNOSIS — M25532 Pain in left wrist: Secondary | ICD-10-CM | POA: Diagnosis not present

## 2020-12-12 DIAGNOSIS — S52612A Displaced fracture of left ulna styloid process, initial encounter for closed fracture: Secondary | ICD-10-CM | POA: Diagnosis not present

## 2020-12-16 DIAGNOSIS — S52502A Unspecified fracture of the lower end of left radius, initial encounter for closed fracture: Secondary | ICD-10-CM | POA: Diagnosis not present

## 2020-12-16 DIAGNOSIS — I10 Essential (primary) hypertension: Secondary | ICD-10-CM | POA: Diagnosis not present

## 2020-12-16 DIAGNOSIS — S52572A Other intraarticular fracture of lower end of left radius, initial encounter for closed fracture: Secondary | ICD-10-CM | POA: Diagnosis not present

## 2020-12-16 DIAGNOSIS — S52612D Displaced fracture of left ulna styloid process, subsequent encounter for closed fracture with routine healing: Secondary | ICD-10-CM | POA: Diagnosis not present

## 2020-12-16 DIAGNOSIS — S52502D Unspecified fracture of the lower end of left radius, subsequent encounter for closed fracture with routine healing: Secondary | ICD-10-CM | POA: Diagnosis not present

## 2020-12-24 DIAGNOSIS — G4733 Obstructive sleep apnea (adult) (pediatric): Secondary | ICD-10-CM | POA: Diagnosis not present

## 2020-12-25 DIAGNOSIS — S52612D Displaced fracture of left ulna styloid process, subsequent encounter for closed fracture with routine healing: Secondary | ICD-10-CM | POA: Diagnosis not present

## 2020-12-25 DIAGNOSIS — S52502A Unspecified fracture of the lower end of left radius, initial encounter for closed fracture: Secondary | ICD-10-CM | POA: Diagnosis not present

## 2020-12-25 DIAGNOSIS — S52502D Unspecified fracture of the lower end of left radius, subsequent encounter for closed fracture with routine healing: Secondary | ICD-10-CM | POA: Diagnosis not present

## 2020-12-26 DIAGNOSIS — F331 Major depressive disorder, recurrent, moderate: Secondary | ICD-10-CM | POA: Diagnosis not present

## 2020-12-26 DIAGNOSIS — Z136 Encounter for screening for cardiovascular disorders: Secondary | ICD-10-CM | POA: Diagnosis not present

## 2020-12-26 DIAGNOSIS — W19XXXD Unspecified fall, subsequent encounter: Secondary | ICD-10-CM | POA: Diagnosis not present

## 2020-12-26 DIAGNOSIS — M25561 Pain in right knee: Secondary | ICD-10-CM | POA: Diagnosis not present

## 2020-12-26 DIAGNOSIS — Z13220 Encounter for screening for lipoid disorders: Secondary | ICD-10-CM | POA: Diagnosis not present

## 2020-12-26 DIAGNOSIS — Z13820 Encounter for screening for osteoporosis: Secondary | ICD-10-CM | POA: Diagnosis not present

## 2020-12-26 DIAGNOSIS — R270 Ataxia, unspecified: Secondary | ICD-10-CM | POA: Diagnosis not present

## 2020-12-26 DIAGNOSIS — R69 Illness, unspecified: Secondary | ICD-10-CM | POA: Diagnosis not present

## 2020-12-26 DIAGNOSIS — I10 Essential (primary) hypertension: Secondary | ICD-10-CM | POA: Diagnosis not present

## 2020-12-26 DIAGNOSIS — Z780 Asymptomatic menopausal state: Secondary | ICD-10-CM | POA: Diagnosis not present

## 2020-12-26 DIAGNOSIS — J8410 Pulmonary fibrosis, unspecified: Secondary | ICD-10-CM | POA: Diagnosis not present

## 2020-12-26 DIAGNOSIS — E538 Deficiency of other specified B group vitamins: Secondary | ICD-10-CM | POA: Diagnosis not present

## 2020-12-26 DIAGNOSIS — F411 Generalized anxiety disorder: Secondary | ICD-10-CM | POA: Diagnosis not present

## 2020-12-26 DIAGNOSIS — Z0000 Encounter for general adult medical examination without abnormal findings: Secondary | ICD-10-CM | POA: Diagnosis not present

## 2020-12-31 DIAGNOSIS — M25661 Stiffness of right knee, not elsewhere classified: Secondary | ICD-10-CM | POA: Diagnosis not present

## 2020-12-31 DIAGNOSIS — R2681 Unsteadiness on feet: Secondary | ICD-10-CM | POA: Diagnosis not present

## 2020-12-31 DIAGNOSIS — M25561 Pain in right knee: Secondary | ICD-10-CM | POA: Diagnosis not present

## 2020-12-31 DIAGNOSIS — R262 Difficulty in walking, not elsewhere classified: Secondary | ICD-10-CM | POA: Diagnosis not present

## 2021-01-01 DIAGNOSIS — M25532 Pain in left wrist: Secondary | ICD-10-CM | POA: Diagnosis not present

## 2021-01-01 DIAGNOSIS — S52572D Other intraarticular fracture of lower end of left radius, subsequent encounter for closed fracture with routine healing: Secondary | ICD-10-CM | POA: Diagnosis not present

## 2021-01-01 DIAGNOSIS — S52502D Unspecified fracture of the lower end of left radius, subsequent encounter for closed fracture with routine healing: Secondary | ICD-10-CM | POA: Diagnosis not present

## 2021-01-02 DIAGNOSIS — R2681 Unsteadiness on feet: Secondary | ICD-10-CM | POA: Diagnosis not present

## 2021-01-02 DIAGNOSIS — M25661 Stiffness of right knee, not elsewhere classified: Secondary | ICD-10-CM | POA: Diagnosis not present

## 2021-01-02 DIAGNOSIS — R262 Difficulty in walking, not elsewhere classified: Secondary | ICD-10-CM | POA: Diagnosis not present

## 2021-01-02 DIAGNOSIS — M25561 Pain in right knee: Secondary | ICD-10-CM | POA: Diagnosis not present

## 2021-01-08 DIAGNOSIS — G4733 Obstructive sleep apnea (adult) (pediatric): Secondary | ICD-10-CM | POA: Diagnosis not present

## 2021-01-08 DIAGNOSIS — J8410 Pulmonary fibrosis, unspecified: Secondary | ICD-10-CM | POA: Diagnosis not present

## 2021-01-08 DIAGNOSIS — I10 Essential (primary) hypertension: Secondary | ICD-10-CM | POA: Diagnosis not present

## 2021-01-08 DIAGNOSIS — S42302D Unspecified fracture of shaft of humerus, left arm, subsequent encounter for fracture with routine healing: Secondary | ICD-10-CM | POA: Diagnosis not present

## 2021-01-09 DIAGNOSIS — R262 Difficulty in walking, not elsewhere classified: Secondary | ICD-10-CM | POA: Diagnosis not present

## 2021-01-09 DIAGNOSIS — M25661 Stiffness of right knee, not elsewhere classified: Secondary | ICD-10-CM | POA: Diagnosis not present

## 2021-01-09 DIAGNOSIS — R2681 Unsteadiness on feet: Secondary | ICD-10-CM | POA: Diagnosis not present

## 2021-01-09 DIAGNOSIS — M25561 Pain in right knee: Secondary | ICD-10-CM | POA: Diagnosis not present

## 2021-01-10 DIAGNOSIS — R262 Difficulty in walking, not elsewhere classified: Secondary | ICD-10-CM | POA: Diagnosis not present

## 2021-01-10 DIAGNOSIS — M25561 Pain in right knee: Secondary | ICD-10-CM | POA: Diagnosis not present

## 2021-01-10 DIAGNOSIS — R2681 Unsteadiness on feet: Secondary | ICD-10-CM | POA: Diagnosis not present

## 2021-01-10 DIAGNOSIS — M25661 Stiffness of right knee, not elsewhere classified: Secondary | ICD-10-CM | POA: Diagnosis not present

## 2021-01-15 DIAGNOSIS — M25561 Pain in right knee: Secondary | ICD-10-CM | POA: Diagnosis not present

## 2021-01-15 DIAGNOSIS — R262 Difficulty in walking, not elsewhere classified: Secondary | ICD-10-CM | POA: Diagnosis not present

## 2021-01-15 DIAGNOSIS — M25661 Stiffness of right knee, not elsewhere classified: Secondary | ICD-10-CM | POA: Diagnosis not present

## 2021-01-15 DIAGNOSIS — R2681 Unsteadiness on feet: Secondary | ICD-10-CM | POA: Diagnosis not present

## 2021-01-20 DIAGNOSIS — G4733 Obstructive sleep apnea (adult) (pediatric): Secondary | ICD-10-CM | POA: Diagnosis not present

## 2021-01-21 DIAGNOSIS — Z780 Asymptomatic menopausal state: Secondary | ICD-10-CM | POA: Diagnosis not present

## 2021-01-21 DIAGNOSIS — M810 Age-related osteoporosis without current pathological fracture: Secondary | ICD-10-CM | POA: Diagnosis not present

## 2021-01-21 DIAGNOSIS — Z13820 Encounter for screening for osteoporosis: Secondary | ICD-10-CM | POA: Diagnosis not present

## 2021-01-22 DIAGNOSIS — M25532 Pain in left wrist: Secondary | ICD-10-CM | POA: Diagnosis not present

## 2021-01-22 DIAGNOSIS — S52572D Other intraarticular fracture of lower end of left radius, subsequent encounter for closed fracture with routine healing: Secondary | ICD-10-CM | POA: Diagnosis not present

## 2021-01-22 DIAGNOSIS — M1812 Unilateral primary osteoarthritis of first carpometacarpal joint, left hand: Secondary | ICD-10-CM | POA: Diagnosis not present

## 2021-01-23 DIAGNOSIS — G4733 Obstructive sleep apnea (adult) (pediatric): Secondary | ICD-10-CM | POA: Diagnosis not present

## 2021-02-10 DIAGNOSIS — X58XXXD Exposure to other specified factors, subsequent encounter: Secondary | ICD-10-CM | POA: Diagnosis not present

## 2021-02-10 DIAGNOSIS — M25532 Pain in left wrist: Secondary | ICD-10-CM | POA: Diagnosis not present

## 2021-02-10 DIAGNOSIS — M6281 Muscle weakness (generalized): Secondary | ICD-10-CM | POA: Diagnosis not present

## 2021-02-10 DIAGNOSIS — M25632 Stiffness of left wrist, not elsewhere classified: Secondary | ICD-10-CM | POA: Diagnosis not present

## 2021-02-10 DIAGNOSIS — S52572D Other intraarticular fracture of lower end of left radius, subsequent encounter for closed fracture with routine healing: Secondary | ICD-10-CM | POA: Diagnosis not present

## 2021-02-17 DIAGNOSIS — S52572D Other intraarticular fracture of lower end of left radius, subsequent encounter for closed fracture with routine healing: Secondary | ICD-10-CM | POA: Diagnosis not present

## 2021-02-17 DIAGNOSIS — M25532 Pain in left wrist: Secondary | ICD-10-CM | POA: Diagnosis not present

## 2021-02-17 DIAGNOSIS — W228XXA Striking against or struck by other objects, initial encounter: Secondary | ICD-10-CM | POA: Diagnosis not present

## 2021-02-17 DIAGNOSIS — M25632 Stiffness of left wrist, not elsewhere classified: Secondary | ICD-10-CM | POA: Diagnosis not present

## 2021-02-17 DIAGNOSIS — S92912A Unspecified fracture of left toe(s), initial encounter for closed fracture: Secondary | ICD-10-CM | POA: Diagnosis not present

## 2021-02-17 DIAGNOSIS — S99912A Unspecified injury of left ankle, initial encounter: Secondary | ICD-10-CM | POA: Diagnosis not present

## 2021-02-17 DIAGNOSIS — S92511B Displaced fracture of proximal phalanx of right lesser toe(s), initial encounter for open fracture: Secondary | ICD-10-CM | POA: Diagnosis not present

## 2021-02-17 DIAGNOSIS — W010XXD Fall on same level from slipping, tripping and stumbling without subsequent striking against object, subsequent encounter: Secondary | ICD-10-CM | POA: Diagnosis not present

## 2021-02-18 DIAGNOSIS — F0390 Unspecified dementia without behavioral disturbance: Secondary | ICD-10-CM | POA: Diagnosis not present

## 2021-02-18 DIAGNOSIS — M25562 Pain in left knee: Secondary | ICD-10-CM | POA: Diagnosis not present

## 2021-02-18 DIAGNOSIS — W19XXXA Unspecified fall, initial encounter: Secondary | ICD-10-CM | POA: Diagnosis not present

## 2021-02-18 DIAGNOSIS — I10 Essential (primary) hypertension: Secondary | ICD-10-CM | POA: Diagnosis not present

## 2021-02-18 DIAGNOSIS — J8410 Pulmonary fibrosis, unspecified: Secondary | ICD-10-CM | POA: Diagnosis not present

## 2021-02-18 DIAGNOSIS — I952 Hypotension due to drugs: Secondary | ICD-10-CM | POA: Diagnosis not present

## 2021-02-18 DIAGNOSIS — R69 Illness, unspecified: Secondary | ICD-10-CM | POA: Diagnosis not present

## 2021-02-18 DIAGNOSIS — Z23 Encounter for immunization: Secondary | ICD-10-CM | POA: Diagnosis not present

## 2021-02-18 DIAGNOSIS — F331 Major depressive disorder, recurrent, moderate: Secondary | ICD-10-CM | POA: Diagnosis not present

## 2021-02-23 DIAGNOSIS — G4733 Obstructive sleep apnea (adult) (pediatric): Secondary | ICD-10-CM | POA: Diagnosis not present

## 2021-02-25 DIAGNOSIS — G4733 Obstructive sleep apnea (adult) (pediatric): Secondary | ICD-10-CM | POA: Diagnosis not present

## 2021-02-25 DIAGNOSIS — R270 Ataxia, unspecified: Secondary | ICD-10-CM | POA: Diagnosis not present

## 2021-02-25 DIAGNOSIS — M25561 Pain in right knee: Secondary | ICD-10-CM | POA: Diagnosis not present

## 2021-02-25 DIAGNOSIS — M6281 Muscle weakness (generalized): Secondary | ICD-10-CM | POA: Diagnosis not present

## 2021-02-28 DIAGNOSIS — W01198A Fall on same level from slipping, tripping and stumbling with subsequent striking against other object, initial encounter: Secondary | ICD-10-CM | POA: Diagnosis not present

## 2021-02-28 DIAGNOSIS — S8992XA Unspecified injury of left lower leg, initial encounter: Secondary | ICD-10-CM | POA: Diagnosis not present

## 2021-02-28 DIAGNOSIS — Y998 Other external cause status: Secondary | ICD-10-CM | POA: Diagnosis not present

## 2021-02-28 DIAGNOSIS — M1712 Unilateral primary osteoarthritis, left knee: Secondary | ICD-10-CM | POA: Diagnosis not present

## 2021-02-28 DIAGNOSIS — S42032A Displaced fracture of lateral end of left clavicle, initial encounter for closed fracture: Secondary | ICD-10-CM | POA: Diagnosis not present

## 2021-02-28 DIAGNOSIS — S8002XA Contusion of left knee, initial encounter: Secondary | ICD-10-CM | POA: Diagnosis not present

## 2021-02-28 DIAGNOSIS — M25462 Effusion, left knee: Secondary | ICD-10-CM | POA: Diagnosis not present

## 2021-02-28 DIAGNOSIS — S52502D Unspecified fracture of the lower end of left radius, subsequent encounter for closed fracture with routine healing: Secondary | ICD-10-CM | POA: Diagnosis not present

## 2021-02-28 DIAGNOSIS — M25532 Pain in left wrist: Secondary | ICD-10-CM | POA: Diagnosis not present

## 2021-03-12 DIAGNOSIS — I10 Essential (primary) hypertension: Secondary | ICD-10-CM | POA: Diagnosis not present

## 2021-03-12 DIAGNOSIS — S42022A Displaced fracture of shaft of left clavicle, initial encounter for closed fracture: Secondary | ICD-10-CM | POA: Diagnosis not present

## 2021-03-12 DIAGNOSIS — M25512 Pain in left shoulder: Secondary | ICD-10-CM | POA: Diagnosis not present

## 2021-03-25 DIAGNOSIS — G4733 Obstructive sleep apnea (adult) (pediatric): Secondary | ICD-10-CM | POA: Diagnosis not present

## 2021-04-02 DIAGNOSIS — J8410 Pulmonary fibrosis, unspecified: Secondary | ICD-10-CM | POA: Diagnosis not present

## 2021-04-02 DIAGNOSIS — J479 Bronchiectasis, uncomplicated: Secondary | ICD-10-CM | POA: Diagnosis not present
